# Patient Record
Sex: Female | Born: 1945 | Race: White | Hispanic: No | Marital: Married | Smoking: Former smoker
Health system: Southern US, Community
[De-identification: ages and names within clinical notes are randomized; demographics above are authoritative.]

## PROBLEM LIST (undated history)

## (undated) DIAGNOSIS — M1990 Unspecified osteoarthritis, unspecified site: Secondary | ICD-10-CM

## (undated) DIAGNOSIS — M722 Plantar fascial fibromatosis: Secondary | ICD-10-CM

## (undated) DIAGNOSIS — I10 Essential (primary) hypertension: Secondary | ICD-10-CM

## (undated) HISTORY — PX: PLANTAR FASCIA RELEASE: SHX2239

---

## 1994-02-16 HISTORY — PX: EYE SURGERY: SHX253

## 1998-02-16 HISTORY — PX: APPENDECTOMY: SHX54

## 2005-08-20 ENCOUNTER — Ambulatory Visit: Payer: Self-pay

## 2006-02-11 ENCOUNTER — Ambulatory Visit: Payer: Self-pay | Admitting: Gastroenterology

## 2006-02-18 ENCOUNTER — Ambulatory Visit: Payer: Self-pay | Admitting: Unknown Physician Specialty

## 2006-08-25 ENCOUNTER — Ambulatory Visit: Payer: Self-pay

## 2007-09-08 ENCOUNTER — Ambulatory Visit: Payer: Self-pay | Admitting: Obstetrics and Gynecology

## 2008-09-20 ENCOUNTER — Ambulatory Visit: Payer: Self-pay

## 2009-11-13 ENCOUNTER — Ambulatory Visit: Payer: Self-pay

## 2010-11-25 ENCOUNTER — Ambulatory Visit: Payer: Self-pay | Admitting: Unknown Physician Specialty

## 2011-11-23 ENCOUNTER — Ambulatory Visit: Payer: Self-pay | Admitting: Gastroenterology

## 2011-11-26 ENCOUNTER — Ambulatory Visit: Payer: Self-pay | Admitting: Obstetrics and Gynecology

## 2012-05-10 ENCOUNTER — Ambulatory Visit: Payer: Self-pay | Admitting: Internal Medicine

## 2012-11-29 ENCOUNTER — Ambulatory Visit: Payer: Self-pay | Admitting: Internal Medicine

## 2013-11-30 ENCOUNTER — Ambulatory Visit: Payer: Self-pay | Admitting: Internal Medicine

## 2014-05-23 ENCOUNTER — Ambulatory Visit
Admit: 2014-05-23 | Disposition: A | Discharge status: Home / Self Care | Payer: Self-pay | Attending: Unknown Physician Specialty | Admitting: Unknown Physician Specialty

## 2014-06-15 ENCOUNTER — Encounter: Payer: Self-pay | Admitting: *Deleted

## 2014-06-18 ENCOUNTER — Ambulatory Visit
Admission: RE | Admit: 2014-06-18 | Discharge: 2014-06-18 | Disposition: A | Discharge status: Home / Self Care | Payer: Medicare Other | Source: Ambulatory Visit | Attending: Rheumatology | Admitting: Rheumatology

## 2014-06-18 DIAGNOSIS — Z029 Encounter for administrative examinations, unspecified: Secondary | ICD-10-CM | POA: Diagnosis not present

## 2014-06-22 ENCOUNTER — Ambulatory Visit: Payer: Medicare Other | Admitting: Anesthesiology

## 2014-06-22 ENCOUNTER — Encounter
Admission: RE | Disposition: A | Discharge status: Home / Self Care | Payer: Self-pay | Source: Ambulatory Visit | Attending: Unknown Physician Specialty

## 2014-06-22 ENCOUNTER — Ambulatory Visit
Admission: RE | Admit: 2014-06-22 | Discharge: 2014-06-22 | Disposition: A | Discharge status: Home / Self Care | Payer: Medicare Other | Source: Ambulatory Visit | Attending: Unknown Physician Specialty | Admitting: Unknown Physician Specialty

## 2014-06-22 ENCOUNTER — Encounter: Payer: Self-pay | Admitting: *Deleted

## 2014-06-22 DIAGNOSIS — M179 Osteoarthritis of knee, unspecified: Secondary | ICD-10-CM | POA: Insufficient documentation

## 2014-06-22 DIAGNOSIS — Z87891 Personal history of nicotine dependence: Secondary | ICD-10-CM | POA: Diagnosis not present

## 2014-06-22 DIAGNOSIS — Y929 Unspecified place or not applicable: Secondary | ICD-10-CM | POA: Insufficient documentation

## 2014-06-22 DIAGNOSIS — X58XXXA Exposure to other specified factors, initial encounter: Secondary | ICD-10-CM | POA: Diagnosis not present

## 2014-06-22 DIAGNOSIS — S83241A Other tear of medial meniscus, current injury, right knee, initial encounter: Secondary | ICD-10-CM | POA: Insufficient documentation

## 2014-06-22 HISTORY — DX: Unspecified osteoarthritis, unspecified site: M19.90

## 2014-06-22 HISTORY — DX: Plantar fascial fibromatosis: M72.2

## 2014-06-22 HISTORY — DX: Essential (primary) hypertension: I10

## 2014-06-22 HISTORY — PX: KNEE ARTHROSCOPY WITH MENISCAL REPAIR: SHX5653

## 2014-06-22 SURGERY — ARTHROSCOPY, KNEE, WITH MENISCUS REPAIR
Anesthesia: Monitor Anesthesia Care | Laterality: Right | Wound class: Clean

## 2014-06-22 MED ORDER — LACTATED RINGERS IV SOLN
INTRAVENOUS | Status: DC
  Administered 2014-06-22: 09:00:00 via INTRAVENOUS

## 2014-06-22 MED ORDER — ACETAMINOPHEN 325 MG PO TABS: 325.0000 mg | ORAL_TABLET | ORAL | Status: DC | PRN

## 2014-06-22 MED ORDER — MIDAZOLAM HCL 5 MG/5ML IJ SOLN
INTRAMUSCULAR | Status: DC | PRN
  Administered 2014-06-22: 2 mg via INTRAVENOUS

## 2014-06-22 MED ORDER — ACETAMINOPHEN 160 MG/5ML PO SOLN
325.0000 mg | ORAL | Status: DC | PRN
Start: 2014-06-22 — End: 2014-06-22

## 2014-06-22 MED ORDER — DEXAMETHASONE SODIUM PHOSPHATE 4 MG/ML IJ SOLN
INTRAMUSCULAR | Status: DC | PRN
  Administered 2014-06-22: 4 mg via INTRAVENOUS

## 2014-06-22 MED ORDER — LIDOCAINE HCL (CARDIAC) 20 MG/ML IV SOLN
INTRAVENOUS | Status: DC | PRN
  Administered 2014-06-22: 30 mg via INTRATRACHEAL

## 2014-06-22 MED ORDER — PROPOFOL 10 MG/ML IV BOLUS
INTRAVENOUS | Status: DC | PRN
  Administered 2014-06-22: 140 mg via INTRAVENOUS

## 2014-06-22 MED ORDER — BUPIVACAINE HCL (PF) 0.5 % IJ SOLN
INTRAMUSCULAR | Status: DC | PRN
  Administered 2014-06-22: 15 mL via INTRA_ARTICULAR

## 2014-06-22 MED ORDER — LACTATED RINGERS IR SOLN
Status: DC | PRN
  Administered 2014-06-22: 3400 mL

## 2014-06-22 MED ORDER — FENTANYL CITRATE (PF) 100 MCG/2ML IJ SOLN
INTRAMUSCULAR | Status: DC | PRN
  Administered 2014-06-22 (×2): 25 ug via INTRAVENOUS
  Administered 2014-06-22: 100 ug via INTRAVENOUS
  Administered 2014-06-22 (×2): 25 ug via INTRAVENOUS

## 2014-06-22 MED ORDER — HYDROCODONE-ACETAMINOPHEN 5-325 MG PO TABS: 1.0000 | ORAL_TABLET | Freq: Four times a day (QID) | ORAL | Status: DC | PRN

## 2014-06-22 MED ORDER — ONDANSETRON HCL 4 MG/2ML IJ SOLN
INTRAMUSCULAR | Status: DC | PRN
  Administered 2014-06-22: 4 mg via INTRAVENOUS

## 2014-06-22 MED ORDER — KETOROLAC TROMETHAMINE 30 MG/ML IJ SOLN
INTRAMUSCULAR | Status: DC | PRN
  Administered 2014-06-22: 15 mg via INTRAVENOUS

## 2014-06-22 SURGICAL SUPPLY — 35 items
ARTHROWAND PARAGON T2 (SURGICAL WAND) ×3
BUR RADIUS 3.5 (BURR) IMPLANT
BUR RADIUS 4.0X18.5 (BURR) ×3 IMPLANT
BUR ROUND 5.5 (BURR) IMPLANT
BURR ROUND 12 FLUTE 4.0MM (BURR) IMPLANT
CUFF TOURN SGL QUICK 24 (TOURNIQUET CUFF)
CUFF TOURN SGL QUICK 30 (MISCELLANEOUS)
CUFF TOURN SGL QUICK 34 (TOURNIQUET CUFF) ×2
CUFF TRNQT CYL 24X4X40X1 (TOURNIQUET CUFF) IMPLANT
CUFF TRNQT CYL 34X4X40X1 (TOURNIQUET CUFF) ×1 IMPLANT
CUFF TRNQT CYL LO 30X4X (MISCELLANEOUS) IMPLANT
CUTTER SLOTTED WHISKER 4.0 (BURR) ×3 IMPLANT
DRAPE LEGGINS SURG 28X43 STRL (DRAPES) ×3 IMPLANT
DURAPREP 26ML APPLICATOR (WOUND CARE) ×3 IMPLANT
GAUZE SPONGE 4X4 12PLY STRL (GAUZE/BANDAGES/DRESSINGS) ×3 IMPLANT
GLOVE BIO SURGEON STRL SZ7.5 (GLOVE) ×3 IMPLANT
GLOVE BIO SURGEON STRL SZ8 (GLOVE) ×3 IMPLANT
GLOVE INDICATOR 8.0 STRL GRN (GLOVE) ×3 IMPLANT
GOWN STRL REIN 2XL XLG LVL4 (GOWN DISPOSABLE) ×3 IMPLANT
GOWN STRL REUS W/TWL 2XL LVL3 (GOWN DISPOSABLE) ×3 IMPLANT
IV LACTATED RINGER IRRG 3000ML (IV SOLUTION)
IV LR IRRIG 3000ML ARTHROMATIC (IV SOLUTION) IMPLANT
MANIFOLD 4PT FOR NEPTUNE1 (MISCELLANEOUS) ×3 IMPLANT
PACK ARTHROSCOPY KNEE (MISCELLANEOUS) ×3 IMPLANT
SET TUBE SUCT SHAVER OUTFL 24K (TUBING) ×3 IMPLANT
SUT ETHILON 3-0 FS-10 30 BLK (SUTURE) ×3
SUTURE EHLN 3-0 FS-10 30 BLK (SUTURE) ×1 IMPLANT
TAPE MICROFOAM 4IN (TAPE) ×3 IMPLANT
TUBING ARTHRO INFLOW-ONLY STRL (TUBING) ×3 IMPLANT
WAND ARTHRO PARAGON T2 (SURGICAL WAND) ×1 IMPLANT
WAND COVAC 50 IFS (MISCELLANEOUS) ×3 IMPLANT
WAND HAND CNTRL MULTIVAC 50 (MISCELLANEOUS) IMPLANT
WAND HAND CNTRL MULTIVAC 90 (MISCELLANEOUS) IMPLANT
WAND MEGAVAC 90 (MISCELLANEOUS) IMPLANT
WAND ULTRAVAC 90 (MISCELLANEOUS) IMPLANT

## 2014-06-22 NOTE — Op Note (Signed)
NAMNorva Davis:  Davis, Sheryl              ACCOUNT NO.:  [id number]  MEDICAL RECORD NO.:  [id number]  LOCATION:  [facility]                        FACILITY:  [facility]  PHYSICIAN:  Randon GoldsmithHarold B Okie Jansson, MD  DATE OF BIRTH:  19-Dec-1945  DATE OF PROCEDURE:  06/22/2014 DATE OF DISCHARGE:  06/22/2014                              OPERATIVE REPORT   PREOPERATIVE DIAGNOSIS:  Torn medial meniscus plus chondral changes.  POSTOPERATIVE DIAGNOSIS:  Torn medial meniscus plus chondral changes.  PROCEDURE PERFORMED:  Arthroscopic partial medial meniscectomy plus chondral debridement.  SURGEON:  Randon GoldsmithHarold B Rylei Codispoti, MD  ANESTHESIA:  General.  HISTORY:  The patient had a long history of right knee pain.  It persisted in spite injection of right knee joint with steroid and anesthetic.  MRI was consistent with torn medial meniscus and some medial compartment chondral changes.  The patient was ultimately brought in for arthroscopy.  DESCRIPTION OF PROCEDURE:  The patient was taken to the operating room where satisfactory general anesthesia was achieved.  A leg holder and tourniquet were applied to the patient's right thigh, and the left lower extremity was supported with a well-leg holder.  The right knee was prepped and draped in the usual fashion for a knee procedure.  The inflow cannula was introduced superomedially.  The joint was distended with lactated Ringer's.  The scope was introduced through an inferolateral puncture wound.  A probe was introduced through an anteromedial puncture wound.  Inspection of the medial compartment along with probing of the medial meniscus revealed a degenerative tear of the posterior third of the medial meniscus.  I resected the tear using a combination of basket biters and motorized resector.  The remaining rim was contoured with an ArthroCare thermal wand.  The patient did have about an 8-10 mm in diameter grade 2-3 chondral lesion about the medial aspect of the medial femoral  condyle.  I coblated this lesion with an ArthroCare Paragon wand.  Inspection of the intercondylar Notch failed to reveal a cruciate injury.  Inspection of the lateral compartment failed to reveal any chondral changes or lateral meniscus pathology.  The trochlear groove was slightly fibrillated. There was some fibrillation of the retropatellar surface which I debrided with a Turbo Whisker.  I then coblated the retropatellar surface with the Paragon ArthroCare wand.  The instruments were removed from the joint, and the puncture wounds were closed with 3-0 nylon in vertical mattress fashion.  Several cubic centimeters of 0.5% Marcaine without epinephrine was injected about each puncture wound, and then Betadine was applied to the wounds followed by sterile dressing and ice pack.  The patient was then awakened and transferred to her stretcher bed.  She was taken to the recovery room in satisfactory condition.  The tourniquet was not inflated during the course of the procedure.  Blood loss was negligible.          ______________________________ Randon GoldsmithHarold B Felcia Huebert, MD     HBK/MEDQ  D:  06/22/2014  T:  06/22/2014  Job:  [id number]

## 2014-06-22 NOTE — Anesthesia Postprocedure Evaluation (Signed)
  Anesthesia Post-op Note  Patient: Sheryl BayleyDorothy Davis  Procedure(s) Performed: Procedure(s): KNEE ARTHROSCOPY WITH MENISCAL REPAIR (Right)  Anesthesia type:MAC  Patient location: PACU  Post pain: Pain level controlled  Post assessment: Post-op Vital signs reviewed, Patient's Cardiovascular Status Stable, Respiratory Function Stable, Patent Airway and No signs of Nausea or vomiting  Post vital signs: Reviewed and stable  Last Vitals:  Filed Vitals:   06/22/14 1120  BP: 157/94  Pulse: 81  Temp:   Resp: 11    Level of consciousness: awake, alert  and patient cooperative  Complications: No apparent anesthesia complications

## 2014-06-22 NOTE — H&P (Signed)
  No changes  In H&P. Have hard copy.

## 2014-06-22 NOTE — Anesthesia Procedure Notes (Signed)
Procedure Name: LMA Insertion Performed by: Theone MurdochJONES, Calirose Mccance Pre-anesthesia Checklist: Patient identified, Emergency Drugs available, Suction available, Timeout performed and Patient being monitored Patient Re-evaluated:Patient Re-evaluated prior to inductionOxygen Delivery Method: Circle system utilized Preoxygenation: Pre-oxygenation with 100% oxygen Intubation Type: IV induction LMA: LMA inserted LMA Size: 4.0 Number of attempts: 1 Placement Confirmation: positive ETCO2 and breath sounds checked- equal and bilateral Tube secured with: Tape

## 2014-06-22 NOTE — Brief Op Note (Signed)
06/22/2014  11:21 AM  PATIENT:  Hipolito Bayleyorothy Madlock  69 y.o. female  PRE-OPERATIVE DIAGNOSIS:  RIGHT KNEE MENISCUS TEAR [phone] OSTEOARTHRITIS RIGHT KNEE M17.11  POST-OPERATIVE DIAGNOSIS: same  PROCEDURE:  Partial medial menisectomy plus chondral debridement   SURGEON:  Surgeon(s) and Role:    * Erin SonsHarold Gwyneth Fernandez, MD - Primary  PHYSICIAN ASSISTANT:   ASSISTANTS: none   ANESTHESIA:   general  EBL:     BLOOD ADMINISTERED:none  DRAINS: none   LOCAL MEDICATIONS USED:  MARCAINE     SPECIMEN:  No Specimen  DISPOSITION OF SPECIMEN:  N/A  COUNTS:  YES  TOURNIQUET:    DICTATION: .Note written in EPIC  PLAN OF CARE: Discharge to home after PACU  PATIENT DISPOSITION:  PACU - hemodynamically stable.   Delay start of Pharmacological VTE agent (>24hrs) due to surgical blood loss or risk of bleeding: not applicable

## 2014-06-22 NOTE — Transfer of Care (Signed)
Immediate Anesthesia Transfer of Care Note  Patient: Sheryl Davis  Procedure(s) Performed: Procedure(s): KNEE ARTHROSCOPY WITH MENISCAL REPAIR (Right)  Patient Location: PACU  Anesthesia Type: MAC  Level of Consciousness: awake, alert  and patient cooperative  Airway and Oxygen Therapy: Patient Spontanous Breathing and Patient connected to supplemental oxygen  Post-op Assessment: Post-op Vital signs reviewed, Patient's Cardiovascular Status Stable, Respiratory Function Stable, Patent Airway and No signs of Nausea or vomiting  Post-op Vital Signs: Reviewed and stable  Complications: No apparent anesthesia complications

## 2014-06-22 NOTE — Discharge Instructions (Signed)
Diet: As you were doing prior to hospitalization   Shower:  May shower but keep the wounds dry, use an occlusive plastic wrap or extremity protector. NO SOAKING IN TUB.   Dressing:  You may remove your dressing 3 days after surgery. Then apply waterproof bandaids and change them after showering.   Activity:  Increase activity slowly as tolerated. Can drive when comfortable.    Weight Bearing: as tolerated.    To prevent constipation: you may use a stool softener such as - Miralax (over the counter) for constipation as needed.    To prevent venous clotting Take one 81 mg. ASA tablet  2X per day for about 2 weeks post surgery.  Precautions:  If you experience chest pain or shortness of breath - call 911 immediately for transfer to the hospital emergency department!!  If you develop a fever greater that 101 F, purulent drainage from wound, increased redness or drainage from wound, or calf pain -- Call the office at [phone]                                             Follow- Up Appointment:  Please call for an appointment to be seen in 1 wk.

## 2014-06-22 NOTE — Anesthesia Preprocedure Evaluation (Signed)
Anesthesia Evaluation  Patient identified by MRN, date of birth, ID band Patient awake    Reviewed: Allergy & Precautions, H&P , NPO status , Patient's Chart, lab work & pertinent test results, reviewed documented beta blocker date and time   Airway Mallampati: I  TM Distance: >3 FB Neck ROM: full    Dental no notable dental hx.    Pulmonary neg pulmonary ROS, former smoker,  breath sounds clear to auscultation  Pulmonary exam normal       Cardiovascular Exercise Tolerance: Good hypertension, negative cardio ROS  Rhythm:regular Rate:Normal     Neuro/Psych negative neurological ROS  negative psych ROS   GI/Hepatic negative GI ROS, Neg liver ROS,   Endo/Other  negative endocrine ROS  Renal/GU negative Renal ROS  negative genitourinary   Musculoskeletal   Abdominal   Peds  Hematology negative hematology ROS (+)   Anesthesia Other Findings   Reproductive/Obstetrics negative OB ROS                             Anesthesia Physical Anesthesia Plan  ASA: II  Anesthesia Plan: MAC   Post-op Pain Management:    Induction:   Airway Management Planned:   Additional Equipment:   Intra-op Plan:   Post-operative Plan:   Informed Consent: I have reviewed the patients History and Physical, chart, labs and discussed the procedure including the risks, benefits and alternatives for the proposed anesthesia with the patient or authorized representative who has indicated his/her understanding and acceptance.     Plan Discussed with: CRNA  Anesthesia Plan Comments:         Anesthesia Quick Evaluation

## 2014-06-26 ENCOUNTER — Encounter: Payer: Self-pay | Admitting: Unknown Physician Specialty

## 2014-11-01 ENCOUNTER — Other Ambulatory Visit: Payer: Self-pay | Admitting: Internal Medicine

## 2014-11-01 DIAGNOSIS — Z1231 Encounter for screening mammogram for malignant neoplasm of breast: Secondary | ICD-10-CM

## 2014-12-03 ENCOUNTER — Ambulatory Visit: Payer: Medicare Other

## 2015-07-19 ENCOUNTER — Ambulatory Visit (INDEPENDENT_AMBULATORY_CARE_PROVIDER_SITE_OTHER)
Admission: RE | Admit: 2015-07-19 | Discharge: 2015-07-19 | Disposition: A | Discharge status: Home / Self Care | Payer: Medicare Other | Source: Ambulatory Visit | Attending: Pulmonary Disease | Admitting: Pulmonary Disease

## 2015-07-19 ENCOUNTER — Ambulatory Visit (INDEPENDENT_AMBULATORY_CARE_PROVIDER_SITE_OTHER): Payer: Medicare Other | Admitting: Pulmonary Disease

## 2015-07-19 ENCOUNTER — Encounter: Payer: Self-pay | Admitting: Pulmonary Disease

## 2015-07-19 VITALS — BP 128/72 | HR 88 | Ht 63.0 in | Wt 213.0 lb

## 2015-07-19 DIAGNOSIS — R0600 Dyspnea, unspecified: Secondary | ICD-10-CM | POA: Diagnosis not present

## 2015-07-19 DIAGNOSIS — R0602 Shortness of breath: Secondary | ICD-10-CM | POA: Diagnosis not present

## 2015-07-19 NOTE — Progress Notes (Signed)
Subjective:    Patient ID: Sheryl Davis, female    DOB: 25-Dec-1945, 70 y.o.   MRN: [id number]  HPI Chief Complaint  Patient presents with  . Advice Only    Self referral for SOB with exertion.  s/s present approx 6 mos.      "My daughter seems to think I have shortness of breath."  Dyspnea: > she believes it is due to her weight > she lives a sedentary lifestyle (work, home) > she says she enjoys eating and she has gained weight over the years > she retired on 3/31 and she has lost weight and her dyspnea has improved > she notes that she would get short of breath with 2 flights of stairs > her daughter notes that she gets short of breath with housework, vacuuming, moving furniture > carrying in groceries is hard > she feels OK when she walks on level ground, but she can't keep up with family like she can > she is quite insistent that this is improving and she doesn't feel it is a problem > she has been more active since retirement and has been watching what she eats, hence the weight loss > no orthopnea, numbness, tingling  Cough > she does cough "sometimes", and brings up mucus sometimes > she attributes to her sinuses   She formerly smoked and she quit smoking in 2000.   > she smoked < 1 PPD, the most was 7-8 a day  "chest colds" > she feels like it is happening more frequently > she has been treated by Dr. Graciela Husbands for bronchtis a few times, but no recent antibiotics > has never received prednisone  Medical therapies: > has had antibiotics years ago > albuterol many years ago when she had albuterol  Hospitalization: > she once had bad pleurisy with bronchits requiring a hospital visit at [facility] 20 years ago     Past Medical History  Diagnosis Date  . Hypertension   . Plantar fasciitis   . Arthritis      Family History  Problem Relation Age of Onset  . Emphysema Father   . Throat cancer Father   . Lung cancer Father      Social History    Social History  . Marital Status: Married    Spouse Name: N/A  . Number of Children: N/A  . Years of Education: N/A   Occupational History  . Not on file.   Social History Main Topics  . Smoking status: Former Smoker -- 0.50 packs/day for 40 years    Types: Cigarettes    Quit date: 06/15/1998  . Smokeless tobacco: Never Used  . Alcohol Use: 0.0 oz/week    0 Standard drinks or equivalent per week     Comment: 1glass wine every few months  . Drug Use: Not on file  . Sexual Activity: Not on file   Other Topics Concern  . Not on file   Social History Narrative     No Known Allergies   Outpatient Prescriptions Prior to Visit  Medication Sig Dispense Refill  . amLODipine (NORVASC) 10 MG tablet Take 5 mg by mouth 2 (two) times daily. AM    . Ascorbic Acid (VITAMIN C PO) Take 1,000 mg by mouth daily.    Marland Kitchen aspirin 81 MG tablet Take 81 mg by mouth daily.    . B Complex-C (SUPER B COMPLEX PO) Take by mouth daily.    . benazepril (LOTENSIN) 10 MG tablet Take 10 mg by  mouth daily. AM    . Bioflavonoid Products (ESTER C PO) Take 1,000 mg by mouth daily.    . calcium carbonate (OS-CAL) 600 MG TABS tablet Take 600 mg by mouth daily.    . Cholecalciferol (VITAMIN D-3 PO) Take 1,000 Int'l Units by mouth daily.    . COCONUT OIL PO Take 1,000 mg by mouth daily.    . Flaxseed, Linseed, (FLAXSEED OIL PO) Take 1,000 mg by mouth daily.    . Garlic 1000 MG CAPS Take 1,000 mg by mouth daily.    Marland Kitchen GINKGO BILOBA PO Take 120 mg by mouth daily.    Marland Kitchen GLUCOSAMINE CHONDROITIN COMPLX PO Take 1 capsule by mouth daily.     . Multiple Vitamins-Minerals (ONE-A-DAY 50 PLUS PO) Take by mouth daily.    . Omega-3 Fatty Acids (FISH OIL PO) Take 1,000 mg by mouth daily.    . Red Yeast Rice Extract (RED YEAST RICE PO) Take 600 mg by mouth daily.    Marland Kitchen VITAMIN E PO Take 400 Int'l Units by mouth daily.    . Zinc 50 MG TABS Take 50 mg by mouth daily.    Marland Kitchen HYDROcodone-acetaminophen (NORCO) 5-325 MG per tablet Take  1-2 tablets by mouth every 6 (six) hours as needed for moderate pain. MAXIMUM TOTAL ACETAMINOPHEN DOSE IS 4000 MG PER DAY (Patient not taking: Reported on 07/19/2015) 20 tablet 0   No facility-administered medications prior to visit.       Review of Systems  Constitutional: Negative for fever and unexpected weight change.  HENT: Positive for postnasal drip. Negative for congestion, dental problem, ear pain, nosebleeds, rhinorrhea, sinus pressure, sneezing, sore throat and trouble swallowing.   Eyes: Negative for redness and itching.  Respiratory: Positive for shortness of breath. Negative for cough, chest tightness and wheezing.   Cardiovascular: Negative for palpitations and leg swelling.  Gastrointestinal: Negative for nausea and vomiting.  Genitourinary: Negative for dysuria.  Musculoskeletal: Negative for joint swelling.  Skin: Negative for rash.  Neurological: Negative for headaches.  Hematological: Does not bruise/bleed easily.  Psychiatric/Behavioral: Negative for dysphoric mood. The patient is not nervous/anxious.        Objective:   Physical Exam Filed Vitals:   07/19/15 1456  BP: 128/72  Pulse: 88  Height: 5\' 3"  (1.6 m)  Weight: 213 lb (96.616 kg)  SpO2: 94%   RA  Gen: obese but well appearing, no acute distress HENT: NCAT, OP clear, neck supple without masses Eyes: PERRL, EOMi Lymph: no cervical lymphadenopathy PULM: CTA B CV: RRR, no mgr, no JVD GI: BS+, soft, nontender, no hsm Derm: no rash or skin breakdown MSK: normal bulk and tone Neuro: A&Ox4, CN II-XII intact, strength 5/5 in all 4 extremities Psyche: normal mood and affect  September 2016 pulmonary function test from [facility]: Ratio 73%, FEV1 1.17 L (83% predicted), FVC 2.35 L (91% predicted), flow volume loop is suggestive of small airways disease, total lung capacity 4.41 L (99% predicted), residual volume is 113% predicted, DLCO 22.8 (111% predicted).  March 2017 hemoglobin is 13.3  Nuclear  stress test> "SPECT images demonstrate homogeneous tracer  distribution throughout the myocardium", LVEF 69%     Assessment & Plan:  Dyspnea Sheryl Davis is here to see me today for evaluation of shortness of breath. While she had an extensive smoking history (20 pack years, she smoked one half pack of cigarettes daily for 40 years and quit 17 years ago), I see no evidence of COPD on today's pulmonary function testing. There  may be a suggestion of small airways disease and a trend towards hyperinflation but there is really no significant evidence of an underlying lung disease from her pulmonary function test right now.  Further, her exam is normal and her resting oximetry is normal today.  I explained to her and her daughter that the differential diagnosis of dyspnea is broad and includes lung disease, heart disease, hematologic conditions, neurologic conditions among other diseases. Fortunately at this time based on the workup that has been performed by her primary care physician (see above) and my assessment today I cannot see clear evidence of an underlying heart lung hematologic or neurologic disease that may have caused her dyspnea.  I think the most likely explanation for her dyspnea is deconditioning and obesity.  Plan: Chest x-ray to ensure there is no evidence of emphysema Trial of Incruise given suggestion of small airways disease on pulmonary function testing She was instructed today to diet and exercise regularly, specifically with calorie counting in walking Follow-up in 3 months, if no improvement in dyspnea the consider a CT chest to evaluate for emphysema not seen previously on chest x-ray      Current outpatient prescriptions:  .  amLODipine (NORVASC) 10 MG tablet, Take 5 mg by mouth 2 (two) times daily. AM, Disp: , Rfl:  .  Ascorbic Acid (VITAMIN C PO), Take 1,000 mg by mouth daily., Disp: , Rfl:  .  aspirin 81 MG tablet, Take 81 mg by mouth daily., Disp: , Rfl:  .  B  Complex-C (SUPER B COMPLEX PO), Take by mouth daily., Disp: , Rfl:  .  benazepril (LOTENSIN) 10 MG tablet, Take 10 mg by mouth daily. AM, Disp: , Rfl:  .  Bioflavonoid Products (ESTER C PO), Take 1,000 mg by mouth daily., Disp: , Rfl:  .  calcium carbonate (OS-CAL) 600 MG TABS tablet, Take 600 mg by mouth daily., Disp: , Rfl:  .  Cholecalciferol (VITAMIN D-3 PO), Take 1,000 Int'l Units by mouth daily., Disp: , Rfl:  .  COCONUT OIL PO, Take 1,000 mg by mouth daily., Disp: , Rfl:  .  Flaxseed, Linseed, (FLAXSEED OIL PO), Take 1,000 mg by mouth daily., Disp: , Rfl:  .  Garlic 1000 MG CAPS, Take 1,000 mg by mouth daily., Disp: , Rfl:  .  GINKGO BILOBA PO, Take 120 mg by mouth daily., Disp: , Rfl:  .  GLUCOSAMINE CHONDROITIN COMPLX PO, Take 1 capsule by mouth daily. , Disp: , Rfl:  .  Multiple Vitamins-Minerals (ONE-A-DAY 50 PLUS PO), Take by mouth daily., Disp: , Rfl:  .  Omega-3 Fatty Acids (FISH OIL PO), Take 1,000 mg by mouth daily., Disp: , Rfl:  .  Red Yeast Rice Extract (RED YEAST RICE PO), Take 600 mg by mouth daily., Disp: , Rfl:  .  VITAMIN E PO, Take 400 Int'l Units by mouth daily., Disp: , Rfl:  .  Zinc 50 MG TABS, Take 50 mg by mouth daily., Disp: , Rfl:

## 2015-07-19 NOTE — Assessment & Plan Note (Signed)
Sheryl Davis is here to see me today for evaluation of shortness of breath. While she had an extensive smoking history (20 pack years, she smoked one half pack of cigarettes daily for 40 years and quit 17 years ago), I see no evidence of COPD on today's pulmonary function testing. There may be a suggestion of small airways disease and a trend towards hyperinflation but there is really no significant evidence of an underlying lung disease from her pulmonary function test right now.  Further, her exam is normal and her resting oximetry is normal today.  I explained to her and her daughter that the differential diagnosis of dyspnea is broad and includes lung disease, heart disease, hematologic conditions, neurologic conditions among other diseases. Fortunately at this time based on the workup that has been performed by her primary care physician (see above) and my assessment today I cannot see clear evidence of an underlying heart lung hematologic or neurologic disease that may have caused her dyspnea.  I think the most likely explanation for her dyspnea is deconditioning and obesity.  Plan: Chest x-ray to ensure there is no evidence of emphysema Trial of Incruise given suggestion of small airways disease on pulmonary function testing She was instructed today to diet and exercise regularly, specifically with calorie counting in walking Follow-up in 3 months, if no improvement in dyspnea the consider a CT chest to evaluate for emphysema not seen previously on chest x-ray

## 2015-07-19 NOTE — Patient Instructions (Signed)
Take the Incruise 1 puff daily, no matter how you feel We will call you with results of the chest x-ray I recommend that you try to lose weight through diet and exercise I recommend that you download My Fitness Pal and log all of your calories I will see you back in 3 months or sooner if needed

## 2015-07-23 ENCOUNTER — Telehealth: Payer: Self-pay | Admitting: *Deleted

## 2015-07-23 ENCOUNTER — Telehealth: Payer: Self-pay | Admitting: Pulmonary Disease

## 2015-07-23 DIAGNOSIS — R9389 Abnormal findings on diagnostic imaging of other specified body structures: Secondary | ICD-10-CM

## 2015-07-23 NOTE — Telephone Encounter (Signed)
Order entry for CT. Nothing further needed.

## 2015-07-23 NOTE — Telephone Encounter (Signed)
-----   Message from Lupita Leashouglas B McQuaid, MD sent at 07/19/2015  5:23 PM EDT ----- A, Please let the patient know this showed scarring in her lungs and the radiologist has suggested that she have a CT scan now.  Please order a HRCT, dyspnea, smoker, Entrikin or Bleitz to read. Thanks, B

## 2015-07-23 NOTE — Telephone Encounter (Signed)
Patient notified of CXR results. High Res CT ordered. Patient aware. Nothing further needed.

## 2015-07-26 ENCOUNTER — Ambulatory Visit (INDEPENDENT_AMBULATORY_CARE_PROVIDER_SITE_OTHER)
Admission: RE | Admit: 2015-07-26 | Discharge: 2015-07-26 | Disposition: A | Discharge status: Home / Self Care | Payer: Medicare Other | Source: Ambulatory Visit | Attending: Pulmonary Disease | Admitting: Pulmonary Disease

## 2015-07-26 DIAGNOSIS — J479 Bronchiectasis, uncomplicated: Secondary | ICD-10-CM

## 2015-07-26 DIAGNOSIS — R0600 Dyspnea, unspecified: Secondary | ICD-10-CM

## 2015-07-26 DIAGNOSIS — R9389 Abnormal findings on diagnostic imaging of other specified body structures: Secondary | ICD-10-CM

## 2015-07-26 DIAGNOSIS — R938 Abnormal findings on diagnostic imaging of other specified body structures: Secondary | ICD-10-CM | POA: Diagnosis not present

## 2015-08-01 DIAGNOSIS — J479 Bronchiectasis, uncomplicated: Secondary | ICD-10-CM | POA: Insufficient documentation

## 2015-10-25 ENCOUNTER — Ambulatory Visit (INDEPENDENT_AMBULATORY_CARE_PROVIDER_SITE_OTHER): Payer: Medicare Other | Admitting: Pulmonary Disease

## 2015-10-25 ENCOUNTER — Encounter: Payer: Self-pay | Admitting: Pulmonary Disease

## 2015-10-25 DIAGNOSIS — J470 Bronchiectasis with acute lower respiratory infection: Secondary | ICD-10-CM

## 2015-10-25 NOTE — Progress Notes (Signed)
Subjective:    Patient ID: Sheryl Davis, female    DOB: [birth year], 70 y.o.   MRN: [id number]  Synopsis: Referred in June 2017 for evaluation of dyspnea after a 20-pack-year smoking history. Lung function testing in September 2016 from [facility] showed no evidence of airflow obstruction but a suggestion of small airways disease.  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 She reports a severe respiratory infection around 2002 which required hospitalization for 3 days.  September 2016 pulmonary function test from [facility]: Ratio 73%, FEV1 1.17 L (83% predicted), FVC 2.35 L (91% predicted), flow volume loop is suggestive of small airways disease, total lung capacity 4.41 L (99% predicted), residual volume is 113% predicted, DLCO 22.8 (111% predicted).  March 2017 hemoglobin is 13.3  Nuclear stress test> "SPECT images demonstrate homogeneous tracer  distribution throughout the myocardium", LVEF 69%  HPI Chief Complaint  Patient presents with  . Follow-up    review CT chest.  pt has no breathing complaints today.     Alexarae has been doing well since the last visit. She denies any shortness of breath. She took the inhaled medicines we prescribed her on the last visit but she said that they really didn't help much. She may have noticed a slight change in cough during that time but she denied any sort of significant improvement in any dyspnea. Specifically, she said she really didn't have much shortness of breath is start with so she didn't expect much improvement. After she stopped taking the inhaled medicines she saw no change in her respiratory status. She remains active but she is a bit frustrated with retirement as she has been bored. No cough, no bronchitis since the last visit.  She recalls a  severe episode of "pleurisy" which required a 3 day hospitalization about 15 years ago.  Past Medical History:  Diagnosis Date  . Arthritis   . Hypertension   . Plantar fasciitis       Review of Systems     Objective:   Physical Exam Vitals:   10/25/15 1637  BP: 136/84  Pulse: 84  SpO2: 95%  Weight: 214 lb 9.6 oz (97.3 kg)  Height: 5\' 3"  (1.6 m)   Room air  Gen: well appearing HENT: OP clear, neck supple PULM: CTA B, normal percussion CV: RRR, no mgr, trace edema GI: BS+, soft, nontender Derm: no cyanosis or rash Psyche: normal mood and affect  CT chest images personally reviewed, see my description above      Assessment & Plan:  Bronchiectasis (HCC) She has mild bronchiectasis changes seen predominantly in the right middle lobe. It seems that this is likely related to a severe respiratory infection she had around 2002 when she was hospitalized for 3 days. I suspect she had right middle lobe lobar pneumonia which led to this very mild finding.  Patients with mild, isolated bronchiectasis are at very slightly increased risk for respiratory infections but this should not really cause any dyspnea.  Fortunately, she is currently completely asymptomatic from a respiratory standpoint.  She did have some mild small airways disease seen on her pulmonary function tests as well as her CT scan. While this does not represent significant obstructive lung disease, its probably somehow related to her prior smoking history.  Plan: Get a flu shot every year Basic and hygiene techniques reviewed today No  indication for inhaled therapy as she is asymptomatic Follow-up as needed If she does develop worsening dyspnea then would repeat pulmonary function testing first    Current Outpatient Prescriptions:  .  amLODipine (NORVASC) 10 MG tablet, Take 5 mg by mouth 2 (two) times daily. AM, Disp: , Rfl:  .  Ascorbic Acid (VITAMIN C PO), Take 1,000 mg by mouth daily., Disp: , Rfl:  .   aspirin 81 MG tablet, Take 81 mg by mouth daily., Disp: , Rfl:  .  B Complex-C (SUPER B COMPLEX PO), Take by mouth daily., Disp: , Rfl:  .  benazepril (LOTENSIN) 10 MG tablet, Take 10 mg by mouth daily. AM, Disp: , Rfl:  .  Bioflavonoid Products (ESTER C PO), Take 1,000 mg by mouth daily., Disp: , Rfl:  .  calcium carbonate (OS-CAL) 600 MG TABS tablet, Take 600 mg by mouth daily., Disp: , Rfl:  .  Cholecalciferol (VITAMIN D-3 PO), Take 1,000 Int'l Units by mouth daily., Disp: , Rfl:  .  COCONUT OIL PO, Take 1,000 mg by mouth daily., Disp: , Rfl:  .  Flaxseed, Linseed, (FLAXSEED OIL PO), Take 1,000 mg by mouth daily., Disp: , Rfl:  .  Garlic 1000 MG CAPS, Take 1,000 mg by mouth daily., Disp: , Rfl:  .  GINKGO BILOBA PO, Take 120 mg by mouth daily., Disp: , Rfl:  .  GLUCOSAMINE CHONDROITIN COMPLX PO, Take 1 capsule by mouth daily. , Disp: , Rfl:  .  Multiple Vitamins-Minerals (ONE-A-DAY 50 PLUS PO), Take by mouth daily., Disp: , Rfl:  .  Omega-3 Fatty Acids (FISH OIL PO), Take 1,000 mg by mouth daily., Disp: , Rfl:  .  Red Yeast Rice Extract (RED YEAST RICE PO), Take 600 mg by mouth daily., Disp: , Rfl:  .  VITAMIN E PO, Take 400 Int'l Units by mouth daily., Disp: , Rfl:  .  Zinc 50 MG TABS, Take 50 mg by mouth daily., Disp: , Rfl:

## 2015-10-25 NOTE — Assessment & Plan Note (Signed)
She has mild bronchiectasis changes seen predominantly in the right middle lobe. It seems that this is likely related to a severe respiratory infection she had around 2002 when she was hospitalized for 3 days. I suspect she had right middle lobe lobar pneumonia which led to this very mild finding.  Patients with mild, isolated bronchiectasis are at very slightly increased risk for respiratory infections but this should not really cause any dyspnea.  Fortunately, she is currently completely asymptomatic from a respiratory standpoint.  She did have some mild small airways disease seen on her pulmonary function tests as well as her CT scan. While this does not represent significant obstructive lung disease, its probably somehow related to her prior smoking history.  Plan: Get a flu shot every year Basic and hygiene techniques reviewed today No indication for inhaled therapy as she is asymptomatic Follow-up as needed If she does develop worsening dyspnea then would repeat pulmonary function testing first

## 2015-10-25 NOTE — Patient Instructions (Signed)
Get a flu shot every year Come back to see us if you develop any respiratory problems

## 2015-11-04 ENCOUNTER — Other Ambulatory Visit: Payer: Self-pay | Admitting: Internal Medicine

## 2015-11-04 DIAGNOSIS — Z1231 Encounter for screening mammogram for malignant neoplasm of breast: Secondary | ICD-10-CM

## 2015-11-13 ENCOUNTER — Ambulatory Visit
Admission: RE | Admit: 2015-11-13 | Discharge: 2015-11-13 | Disposition: A | Discharge status: Home / Self Care | Payer: Medicare Other | Source: Ambulatory Visit | Attending: Internal Medicine | Admitting: Internal Medicine

## 2015-11-13 DIAGNOSIS — Z1231 Encounter for screening mammogram for malignant neoplasm of breast: Secondary | ICD-10-CM | POA: Insufficient documentation

## 2016-11-05 ENCOUNTER — Other Ambulatory Visit: Payer: Self-pay | Admitting: Internal Medicine

## 2016-11-05 DIAGNOSIS — Z1231 Encounter for screening mammogram for malignant neoplasm of breast: Secondary | ICD-10-CM

## 2016-11-24 ENCOUNTER — Ambulatory Visit
Admission: RE | Admit: 2016-11-24 | Discharge: 2016-11-24 | Disposition: A | Discharge status: Home / Self Care | Payer: Medicare Other | Source: Ambulatory Visit | Attending: Internal Medicine | Admitting: Internal Medicine

## 2016-11-24 DIAGNOSIS — Z1231 Encounter for screening mammogram for malignant neoplasm of breast: Secondary | ICD-10-CM | POA: Diagnosis present

## 2017-03-27 IMAGING — MG MM DIGITAL SCREENING BILAT W/ TOMO W/ CAD
8 of 13 series · 8 of 29 positions shown · non-contrast
Comparison: Previous exam(s).

CLINICAL DATA: Screening.

EXAM:
2D DIGITAL SCREENING BILATERAL MAMMOGRAM WITH CAD AND ADJUNCT TOMO

[R MLO (1 of 2)]
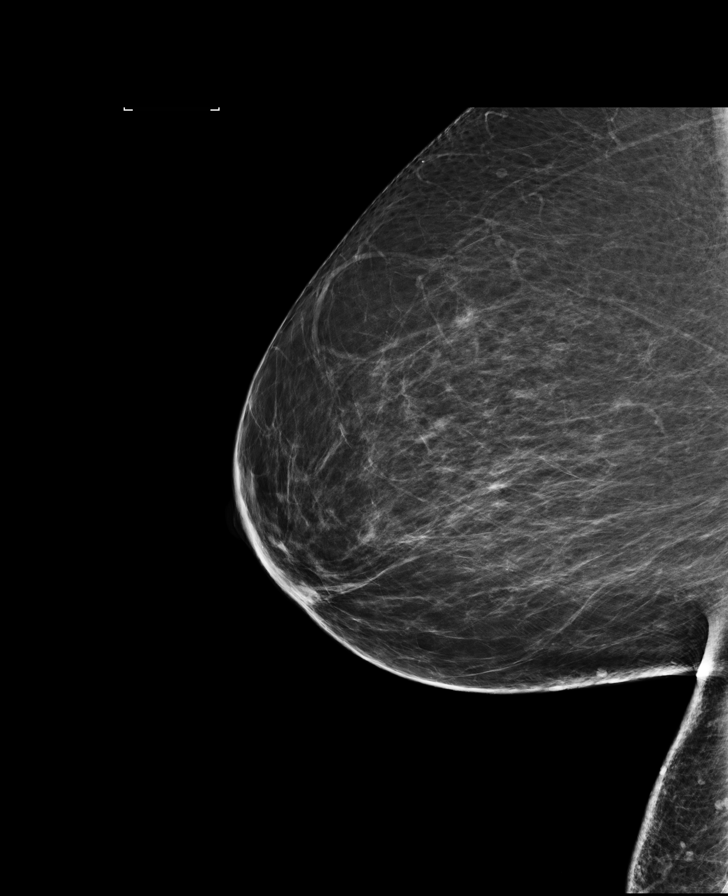

[R MLO synth-2D]
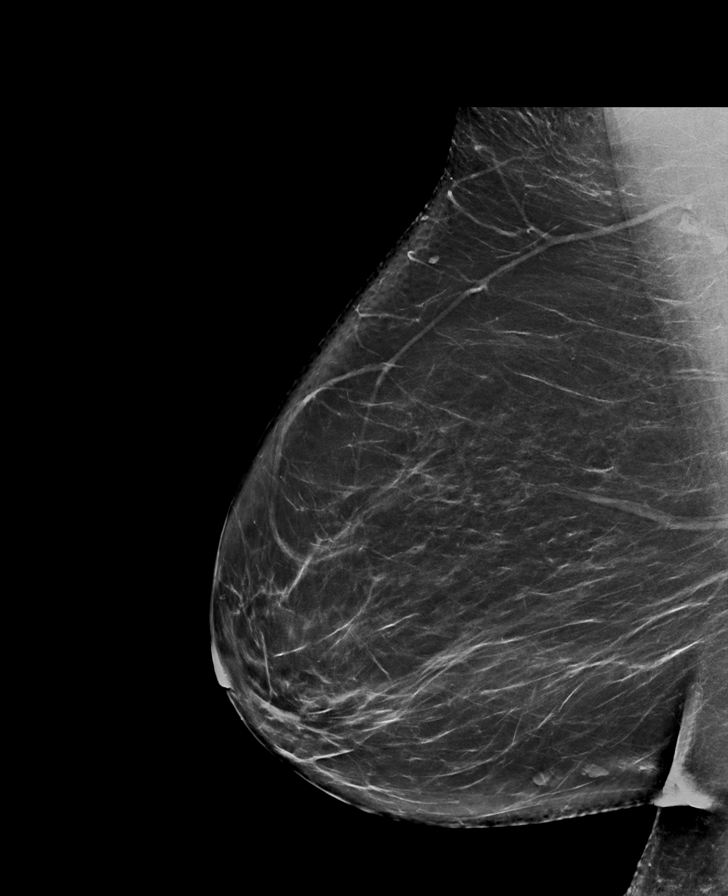

[L CC]
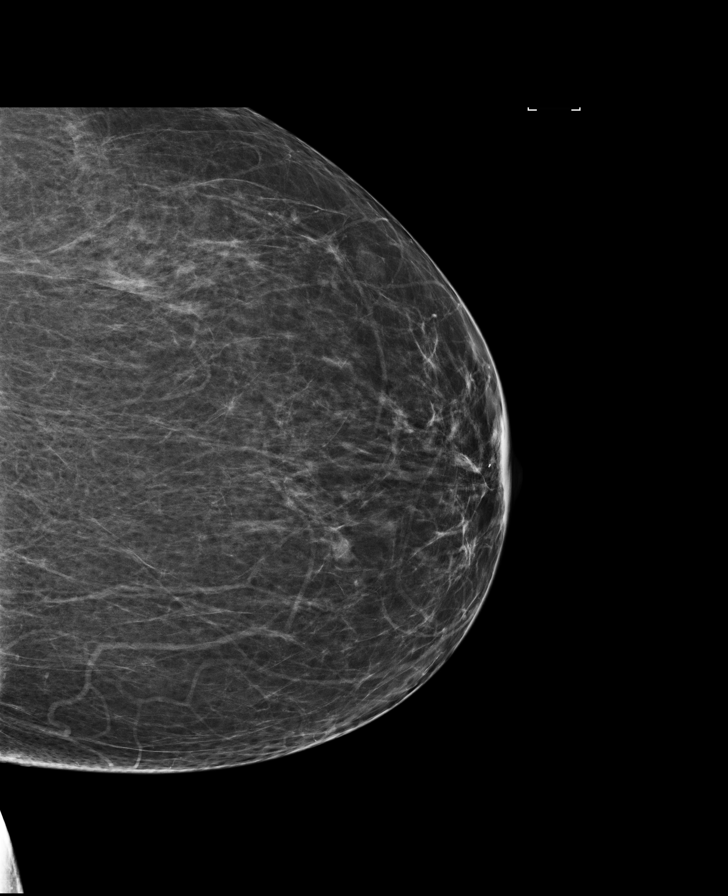

[R CC synth-2D]
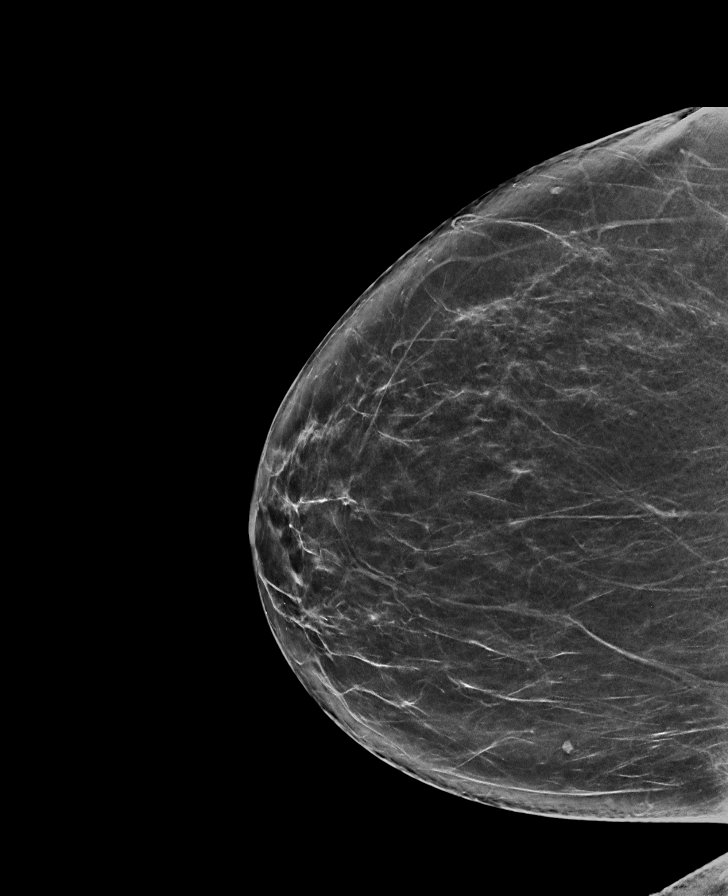

[L CC synth-2D]
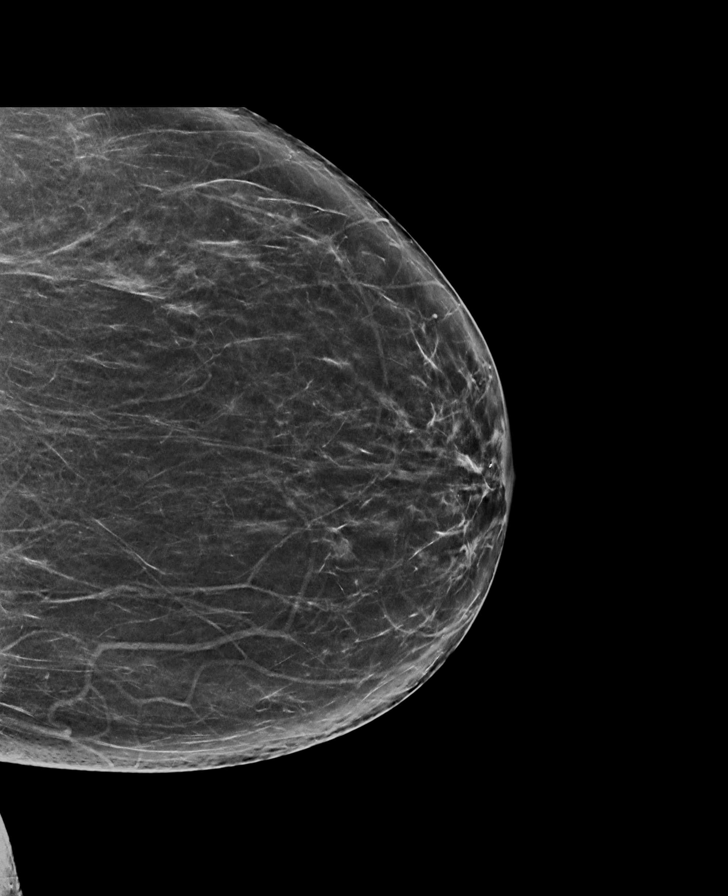

[R CC]
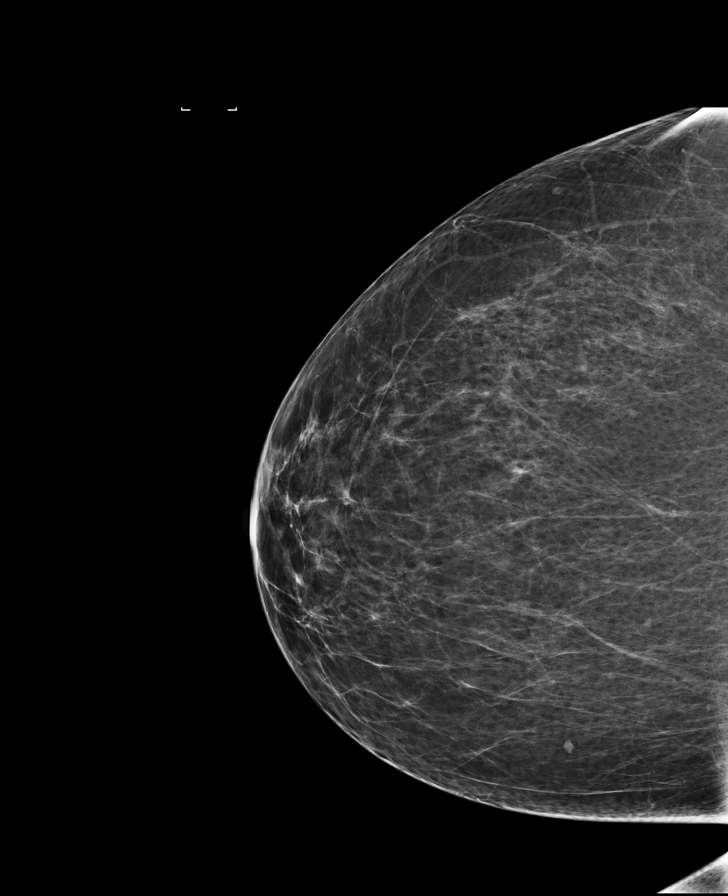

[R MLO (2 of 2)]
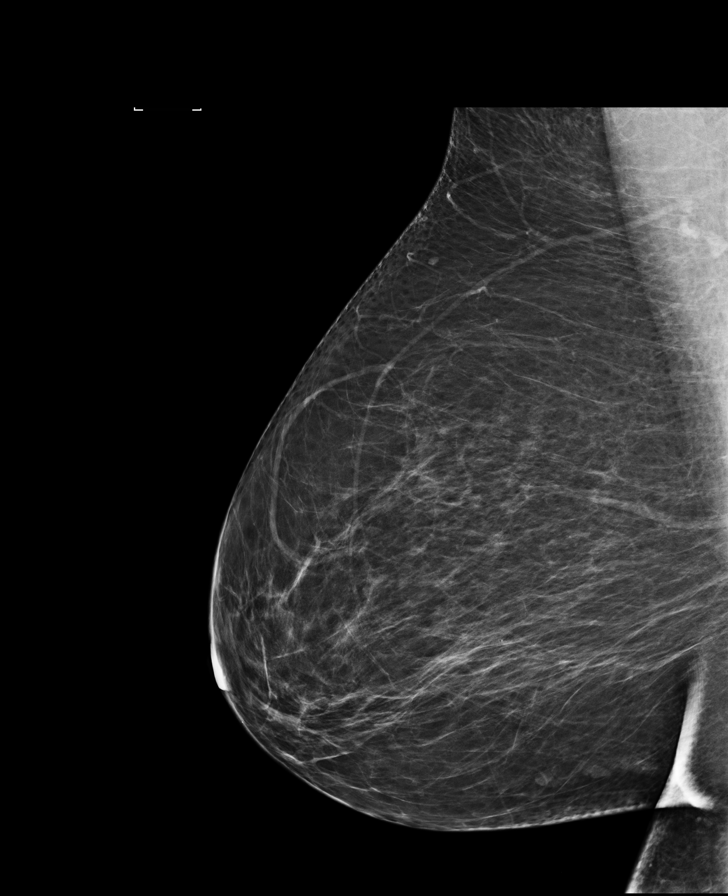

[L MLO]
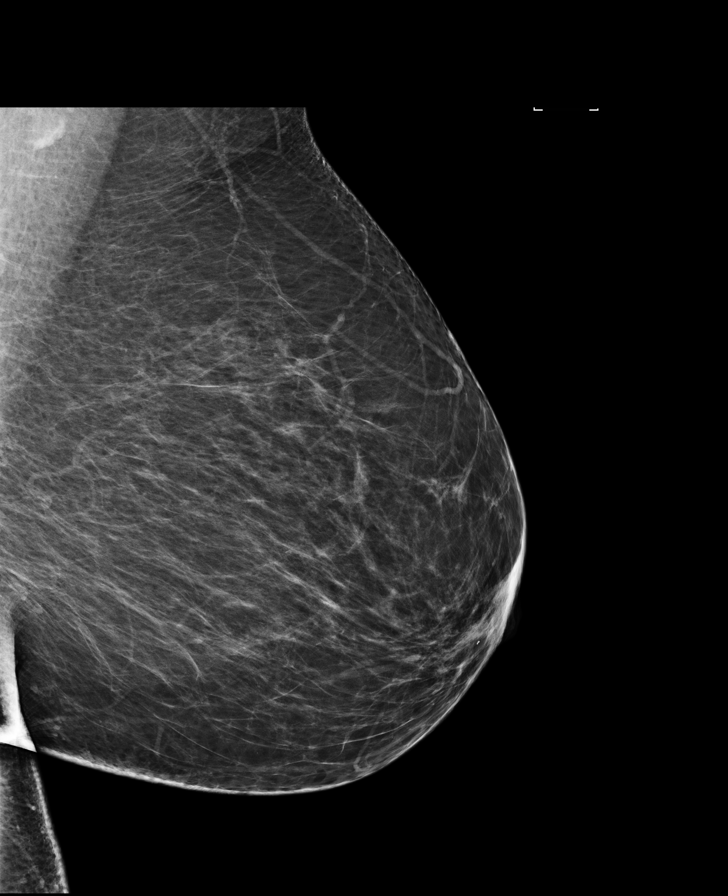

[8 of 29 positions shown; findings below may reference images not displayed]

ACR Breast Density Category b: There are scattered areas of
fibroglandular density.
FINDINGS: There are no findings suspicious for malignancy. Images were
processed with CAD.
IMPRESSION: No mammographic evidence of malignancy. A result letter of this
screening mammogram will be mailed directly to the patient.

RECOMMENDATION:
Screening mammogram in one year. (Code:97-6-RS4)

BI-RADS CATEGORY  1: Negative.

## 2018-02-22 ENCOUNTER — Other Ambulatory Visit: Payer: Self-pay | Admitting: Internal Medicine

## 2018-02-22 DIAGNOSIS — Z1231 Encounter for screening mammogram for malignant neoplasm of breast: Secondary | ICD-10-CM

## 2018-03-22 ENCOUNTER — Ambulatory Visit
Admission: RE | Admit: 2018-03-22 | Discharge: 2018-03-22 | Disposition: A | Discharge status: Home / Self Care | Payer: Medicare Other | Source: Ambulatory Visit | Attending: Internal Medicine | Admitting: Internal Medicine

## 2018-03-22 DIAGNOSIS — Z1231 Encounter for screening mammogram for malignant neoplasm of breast: Secondary | ICD-10-CM | POA: Diagnosis not present

## 2019-02-28 ENCOUNTER — Other Ambulatory Visit: Payer: Self-pay | Admitting: Internal Medicine

## 2019-02-28 DIAGNOSIS — Z1231 Encounter for screening mammogram for malignant neoplasm of breast: Secondary | ICD-10-CM

## 2019-03-24 ENCOUNTER — Ambulatory Visit
Admission: RE | Admit: 2019-03-24 | Discharge: 2019-03-24 | Disposition: A | Discharge status: Home / Self Care | Payer: Medicare Other | Source: Ambulatory Visit | Attending: Internal Medicine | Admitting: Internal Medicine

## 2019-03-24 DIAGNOSIS — Z1231 Encounter for screening mammogram for malignant neoplasm of breast: Secondary | ICD-10-CM | POA: Insufficient documentation

## 2021-07-07 ENCOUNTER — Other Ambulatory Visit: Payer: Medicare Other

## 2021-07-08 ENCOUNTER — Ambulatory Visit
Admission: RE | Admit: 2021-07-08 | Discharge: 2021-07-08 | Disposition: A | Discharge status: Home / Self Care | Payer: Medicare Other | Source: Ambulatory Visit | Attending: Internal Medicine | Admitting: Internal Medicine

## 2021-07-08 ENCOUNTER — Other Ambulatory Visit: Payer: Self-pay | Admitting: Internal Medicine

## 2021-07-08 DIAGNOSIS — K5792 Diverticulitis of intestine, part unspecified, without perforation or abscess without bleeding: Secondary | ICD-10-CM

## 2021-07-08 MED ORDER — IOPAMIDOL (ISOVUE-300) INJECTION 61%
100.0000 mL | Freq: Once | INTRAVENOUS | Status: AC | PRN
  Administered 2021-07-08: 100 mL via INTRAVENOUS

## 2022-11-16 ENCOUNTER — Other Ambulatory Visit: Payer: Self-pay | Admitting: Internal Medicine

## 2022-11-16 DIAGNOSIS — I10 Essential (primary) hypertension: Secondary | ICD-10-CM

## 2022-11-16 DIAGNOSIS — E7849 Other hyperlipidemia: Secondary | ICD-10-CM

## 2022-11-20 ENCOUNTER — Other Ambulatory Visit: Payer: Self-pay | Admitting: Internal Medicine

## 2022-11-20 DIAGNOSIS — Z1231 Encounter for screening mammogram for malignant neoplasm of breast: Secondary | ICD-10-CM

## 2022-11-25 ENCOUNTER — Ambulatory Visit
Admission: RE | Admit: 2022-11-25 | Discharge: 2022-11-25 | Disposition: A | Discharge status: Home / Self Care | Payer: Self-pay | Source: Ambulatory Visit | Attending: Internal Medicine | Admitting: Internal Medicine

## 2022-11-25 DIAGNOSIS — I10 Essential (primary) hypertension: Secondary | ICD-10-CM | POA: Insufficient documentation

## 2022-11-25 DIAGNOSIS — E7849 Other hyperlipidemia: Secondary | ICD-10-CM | POA: Insufficient documentation

## 2022-11-26 ENCOUNTER — Ambulatory Visit
Admission: RE | Admit: 2022-11-26 | Discharge: 2022-11-26 | Disposition: A | Discharge status: Home / Self Care | Payer: Medicare Other | Source: Ambulatory Visit | Attending: Internal Medicine | Admitting: Internal Medicine

## 2022-11-26 DIAGNOSIS — Z1231 Encounter for screening mammogram for malignant neoplasm of breast: Secondary | ICD-10-CM | POA: Diagnosis present

## 2022-12-31 ENCOUNTER — Emergency Department: Payer: Medicare Other

## 2022-12-31 ENCOUNTER — Observation Stay
Admission: EM | Admit: 2022-12-31 | Discharge: 2023-01-01 | Disposition: A | Discharge status: Home / Self Care | Payer: Medicare Other | Attending: Internal Medicine | Admitting: Internal Medicine

## 2022-12-31 DIAGNOSIS — Z87891 Personal history of nicotine dependence: Secondary | ICD-10-CM | POA: Diagnosis not present

## 2022-12-31 DIAGNOSIS — I10 Essential (primary) hypertension: Secondary | ICD-10-CM | POA: Diagnosis not present

## 2022-12-31 DIAGNOSIS — Z79899 Other long term (current) drug therapy: Secondary | ICD-10-CM | POA: Insufficient documentation

## 2022-12-31 DIAGNOSIS — Z7982 Long term (current) use of aspirin: Secondary | ICD-10-CM | POA: Insufficient documentation

## 2022-12-31 DIAGNOSIS — I200 Unstable angina: Principal | ICD-10-CM | POA: Diagnosis present

## 2022-12-31 DIAGNOSIS — E785 Hyperlipidemia, unspecified: Secondary | ICD-10-CM | POA: Diagnosis present

## 2022-12-31 DIAGNOSIS — R0602 Shortness of breath: Secondary | ICD-10-CM | POA: Diagnosis present

## 2022-12-31 LAB — BASIC METABOLIC PANEL
Anion gap: 8 (ref 5–15)
BUN: 13 mg/dL (ref 8–23)
CO2: 26 mmol/L (ref 22–32)
Calcium: 8.8 mg/dL — ABNORMAL LOW (ref 8.9–10.3)
Chloride: 107 mmol/L (ref 98–111)
Creatinine, Ser: 0.75 mg/dL (ref 0.44–1.00)
GFR, Estimated: >60 mL/min (ref >60)
Glucose, Bld: 86 mg/dL (ref 70–99)
Potassium: 4.3 mmol/L (ref 3.5–5.1)
Sodium: 141 mmol/L (ref 135–145)

## 2022-12-31 LAB — HEPARIN LEVEL (UNFRACTIONATED): Heparin Unfractionated: 0.24 [IU]/mL — ABNORMAL LOW (ref 0.30–0.70)

## 2022-12-31 LAB — CBC
HCT: 40.6 % (ref 36.0–46.0)
Hemoglobin: 12.8 g/dL (ref 12.0–15.0)
MCH: 28.9 pg (ref 26.0–34.0)
MCHC: 31.5 g/dL (ref 30.0–36.0)
MCV: 91.6 fL (ref 80.0–100.0)
Platelets: 220 10*3/uL (ref 150–400)
RBC: 4.43 MIL/uL (ref 3.87–5.11)
RDW: 13.6 % (ref 11.5–15.5)
WBC: 8 10*3/uL (ref 4.0–10.5)
nRBC: 0 % (ref 0.0–0.2)

## 2022-12-31 LAB — TROPONIN I (HIGH SENSITIVITY)
Troponin I (High Sensitivity): 4 ng/L (ref <18)
Troponin I (High Sensitivity): 5 ng/L (ref <18)
Troponin I (High Sensitivity): 5 ng/L (ref <18)

## 2022-12-31 LAB — APTT: aPTT: 28 s (ref 24–36)

## 2022-12-31 LAB — PROTIME-INR
INR: 1.1 (ref 0.8–1.2)
Prothrombin Time: 13.9 s (ref 11.4–15.2)

## 2022-12-31 MED ORDER — LABETALOL HCL 5 MG/ML IV SOLN
5.0000 mg | INTRAVENOUS | Status: DC | PRN
  Administered 2023-01-01: 5 mg via INTRAVENOUS
  Filled 2022-12-31: qty 4

## 2022-12-31 MED ORDER — HEPARIN BOLUS VIA INFUSION
1100.0000 [IU] | Freq: Once | INTRAVENOUS | Status: AC
  Administered 2022-12-31: 1100 [IU] via INTRAVENOUS
  Filled 2022-12-31: qty 1100

## 2022-12-31 MED ORDER — ONDANSETRON HCL 4 MG/2ML IJ SOLN: 4.0000 mg | Freq: Four times a day (QID) | INTRAMUSCULAR | Status: DC | PRN

## 2022-12-31 MED ORDER — HEPARIN BOLUS VIA INFUSION
4000.0000 [IU] | Freq: Once | INTRAVENOUS | Status: AC
  Administered 2022-12-31: 4000 [IU] via INTRAVENOUS
  Filled 2022-12-31: qty 4000

## 2022-12-31 MED ORDER — AMLODIPINE BESYLATE 5 MG PO TABS
5.0000 mg | ORAL_TABLET | Freq: Two times a day (BID) | ORAL | Status: DC
  Administered 2022-12-31 – 2023-01-01 (×2): 5 mg via ORAL
  Filled 2022-12-31 (×2): qty 1

## 2022-12-31 MED ORDER — ASPIRIN 81 MG PO TBEC
81.0000 mg | DELAYED_RELEASE_TABLET | Freq: Every day | ORAL | Status: DC
  Administered 2023-01-01: 81 mg via ORAL
  Filled 2022-12-31: qty 1

## 2022-12-31 MED ORDER — ASPIRIN 325 MG PO TABS
325.0000 mg | ORAL_TABLET | Freq: Once | ORAL | Status: AC
  Administered 2022-12-31: 325 mg via ORAL
  Filled 2022-12-31: qty 1

## 2022-12-31 MED ORDER — HEPARIN (PORCINE) 25000 UT/250ML-% IV SOLN
1050.0000 [IU]/h | INTRAVENOUS | Status: DC
  Administered 2022-12-31: 900 [IU]/h via INTRAVENOUS
  Administered 2023-01-01: 1050 [IU]/h via INTRAVENOUS
  Filled 2022-12-31 (×2): qty 250

## 2022-12-31 MED ORDER — BENAZEPRIL HCL 10 MG PO TABS
10.0000 mg | ORAL_TABLET | Freq: Every day | ORAL | Status: DC
  Administered 2023-01-01: 10 mg via ORAL
  Filled 2022-12-31: qty 1

## 2022-12-31 MED ORDER — ACETAMINOPHEN 325 MG PO TABS: 650.0000 mg | ORAL_TABLET | ORAL | Status: DC | PRN

## 2022-12-31 MED ORDER — ROSUVASTATIN CALCIUM 20 MG PO TABS
10.0000 mg | ORAL_TABLET | Freq: Every day | ORAL | Status: DC
  Administered 2023-01-01: 10 mg via ORAL
  Filled 2022-12-31: qty 1

## 2022-12-31 NOTE — Assessment & Plan Note (Addendum)
Patient presents with progressively worsening chest discomfort and dyspnea with exertion.  Recent positive outpatient nuke stress test. -- [facility] cardiology consulted --Treated with IV Heparin drip  --Cardiac cath done today - see report -- no interventions.  Medical mgmt recommended. --Follow-up with cardiology

## 2022-12-31 NOTE — Consult Note (Signed)
Pharmacy Consult Note - Anticoagulation  Pharmacy Consult for heparin Indication: chest pain/ACS  PATIENT MEASUREMENTS: Height: 5\' 3"  (160 cm) Weight: 89.8 kg (198 lb) IBW/kg (Calculated) : 52.4 HEPARIN DW (KG): 72.8  VITAL SIGNS: Temp: 97.9 F (36.6 C) (11/14 1147) Temp Source: Oral (11/14 1147) BP: 153/88 (11/14 1147) Pulse Rate: 80 (11/14 1147)  Recent Labs    12/31/22 1146 12/31/22 1249 12/31/22 1424  HGB 12.8  --   --   HCT 40.6  --   --   PLT 220  --   --   APTT  --  28  --   LABPROT  --  13.9  --   INR  --  1.1  --   CREATININE 0.75  --   --   TROPONINIHS 4  --  5    Estimated Creatinine Clearance: 61.7 mL/min (by C-G formula based on SCr of 0.75 mg/dL).  PAST MEDICAL HISTORY: Past Medical History:  Diagnosis Date   Arthritis    Hypertension    Plantar fasciitis     ASSESSMENT: 77 y.o. female with PMH HTN, HLD, anemia, obesity, prediabetes is presenting with worsening chest tightness and DOE. Recently seen in October by PCP regarding coronary artery calcification as seen on CT scan. Here in ED her ECG is exhibiting ST changes and her cTn is flat at 4. Patient is not on chronic anticoagulation per chart review. Pharmacy has been consulted to initiate and manage heparin intravenous infusion.  Pertinent medications: No chronic anticoagulation prior to admission per chart review  Goal(s) of therapy: Heparin level 0.3 - 0.7 units/mL Monitor platelets by anticoagulation protocol: Yes   Baseline anticoagulation labs: Recent Labs    12/31/22 1146 12/31/22 1249  APTT  --  28  INR  --  1.1  HGB 12.8  --   PLT 220  --      PLAN: anti-Xa level subtherapeutic Give 1100 units bolus x1; then increase heparin infusion rate to 1050 units/hour. Check heparin level in 8 hours after rate change, then daily once at least two levels are consecutively therapeutic. Monitor CBC daily while on heparin infusion.  Burnis Medin, PharmD, BCPS Clinical  Pharmacist 12/31/2022 3:14 PM

## 2022-12-31 NOTE — Assessment & Plan Note (Addendum)
BP mildly elevated 153/88 on admission. Resumed on home regimen  IV labetalol PRN

## 2022-12-31 NOTE — Assessment & Plan Note (Addendum)
Recent lipid panel 11/06/2022 -total cholesterol 254, LDL 166, HDL 51.5, triglycerides 184. -- Crestor

## 2022-12-31 NOTE — ED Provider Notes (Signed)
[facility] Provider Note    Event Date/Time   First MD Initiated Contact with Patient 12/31/22 1224     (approximate)   History   Chief Complaint: Chest Pain and Shortness of Breath   HPI  Sheryl Davis is a 77 y.o. female sent to the ED from her cardiology clinic due to worsening chest tightness and shortness of breath with exertion for the last several days, worsening.  Now having pain at rest.  Had an abnormal cardiac stress test on 12/17/2022, due to severity of ongoing symptoms was sent to the ED today for admission and in-hospital cardiac catheterization          Physical Exam   Triage Vital Signs: ED Triage Vitals  Encounter Vitals Group     BP 12/31/22 1147 (!) 153/88     Systolic BP Percentile --      Diastolic BP Percentile --      Pulse Rate 12/31/22 1147 80     Resp 12/31/22 1147 16     Temp 12/31/22 1147 97.9 F (36.6 C)     Temp Source 12/31/22 1147 Oral     SpO2 12/31/22 1147 95 %     Weight 12/31/22 1143 198 lb (89.8 kg)     Height 12/31/22 1143 5\' 3"  (1.6 m)     Head Circumference --      Peak Flow --      Pain Score 12/31/22 1143 8     Pain Loc --      Pain Education --      Exclude from Growth Chart --     Most recent vital signs: Vitals:   12/31/22 1147  BP: (!) 153/88  Pulse: 80  Resp: 16  Temp: 97.9 F (36.6 C)  SpO2: 95%    General: Awake, no distress.  CV:  Good peripheral perfusion.  Regular rate rhythm Resp:  Normal effort.  Clear to auscultation bilaterally Abd:  No distention.  Other:  No lower extremity edema   ED Results / Procedures / Treatments   Labs (all labs ordered are listed, but only abnormal results are displayed) Labs Reviewed  BASIC METABOLIC PANEL - Abnormal; Notable for the following components:      Result Value   Calcium 8.8 (*)    All other components within normal limits  CBC  APTT  PROTIME-INR  HEPARIN LEVEL (UNFRACTIONATED)  TROPONIN I (HIGH SENSITIVITY)      EKG Interpreted by me Normal sinus rhythm rate of 74.  Normal axis, normal intervals.  Normal QRS ST segments and T waves.   RADIOLOGY Chest x-ray interpreted by me, appears normal.  Radiology report reviewed   PROCEDURES:  .Critical Care  Performed by: Sharman Cheek, MD Authorized by: Sharman Cheek, MD   Critical care provider statement:    Critical care time (minutes):  35   Critical care time was exclusive of:  Separately billable procedures and treating other patients   Critical care was necessary to treat or prevent imminent or life-threatening deterioration of the following conditions:  Cardiac failure and circulatory failure   Critical care was time spent personally by me on the following activities:  Development of treatment plan with patient or surrogate, discussions with consultants, evaluation of patient's response to treatment, examination of patient, obtaining history from patient or surrogate, ordering and performing treatments and interventions, ordering and review of laboratory studies, ordering and review of radiographic studies, pulse oximetry, re-evaluation of patient's condition and review of old  charts   Care discussed with: admitting provider      MEDICATIONS ORDERED IN ED: Medications  heparin ADULT infusion 100 units/mL (25000 units/230mL) (has no administration in time range)  heparin bolus via infusion 4,000 Units (has no administration in time range)     IMPRESSION / MDM / ASSESSMENT AND PLAN / ED COURSE  I reviewed the triage vital signs and the nursing notes.  DDx: Non-STEMI, unstable angina, electrolyte abnormality, anemia  Patient's presentation is most consistent with acute presentation with potential threat to life or bodily function.  Patient sent to ED due to worsening exertional symptoms concerning for unstable angina.  Vital signs unremarkable, EKG and chest x-ray okay for now.  Discussed with patient's cardiology team, will  contact hospitalist for admission, start heparin.    ----------------------------------------- 1:10 PM on 12/31/2022 ----------------------------------------- Initial troponin normal.  Heparin ordered per pharmacy protocol.  Case discussed with hospitalist for further management.     FINAL CLINICAL IMPRESSION(S) / ED DIAGNOSES   Final diagnoses:  Unstable angina (HCC)     Rx / DC Orders   ED Discharge Orders     None        Note:  This document was prepared using Dragon voice recognition software and may include unintentional dictation errors.   Sharman Cheek, MD 12/31/22 1311

## 2022-12-31 NOTE — ED Triage Notes (Addendum)
Pt c/o intermittent chest pain and SOB for the last several days. Pt recently had positive cardiac stress test on 10/31 with plan for heart catheterization.

## 2022-12-31 NOTE — H&P (Signed)
History and Physical    Patient: Sheryl Davis WJX:[id number] DOB: 11-02-1945 DOA: 12/31/2022 DOS: the patient was seen and examined on 12/31/2022 PCP: Lynnea Ferrier, MD  Patient coming from: Home  Chief Complaint:  Chief Complaint  Patient presents with   Chest Pain   Shortness of Breath   HPI: Sheryl Davis is a 77 y.o. female with medical history significant of hypertension, hyperlipidemia, prediabetes, osteoarthritis who presents to the ED from [facility] cardiology clinic today for evaluation of chest pain and shortness of breath notably worse on exertion.  She was referred by PCP to cardiology and underwent nuclear stress test on 12/17/2022 that was grossly positive for reversible ischemia.  Outpatient cardiac cath was planned but still pending.  Patient presenting today with ongoing episodes of chest pain described as severe pressure that occur and worsen with any exertion.  Dyspnea with exertion has been getting progressively worse over time as well.  She denies recent illnesses or symptoms including fever/chills, sore throat, congestion, headaches, abdominal pain, nausea, vomiting, diarrhea, dysuria, unilateral weakness numbness or tingling or any other complaints.  ED course: Temp 97.9, HR 80, RR 16, BP 153/88, SpO2 95% on room air. Labs: BMP normal except calcium 8.8.  CBC normal.  Normal coags.   Normal hs-troponin at 4. EKG: Normal sinus rhythm at 74 bpm, nonspecific ST-T changes, no ST elevation. Chest x-ray: Formal read is pending.  Images personally reviewed and appears no acute findings.  Patient was started on heparin in the ED and is being admitted for observation and further evaluation and management per [facility] cardiology including cardiac cath.    Review of Systems: As mentioned in the history of present illness. All other systems reviewed and are negative.   Past Medical History:  Diagnosis Date   Arthritis    Hypertension    Plantar fasciitis    Past Surgical  History:  Procedure Laterality Date   APPENDECTOMY  2000   EYE SURGERY Bilateral 1996   cataracts   KNEE ARTHROSCOPY WITH MENISCAL REPAIR Right 06/22/2014   Procedure: KNEE ARTHROSCOPY WITH MENISCAL REPAIR;  Surgeon: Erin Sons, MD;  Location: [facility];  Service: Orthopedics;  Laterality: Right;   PLANTAR FASCIA RELEASE     Social History:  reports that she quit smoking about 24 years ago. Her smoking use included cigarettes. She started smoking about 64 years ago. She has a 20 pack-year smoking history. She has never used smokeless tobacco. She reports current alcohol use. No history on file for drug use.  No Known Allergies  Family History  Problem Relation Age of Onset   Emphysema Father    Throat cancer Father    Lung cancer Father    Breast cancer Neg Hx     Prior to Admission medications   Medication Sig Start Date End Date Taking? Authorizing Provider  amLODipine (NORVASC) 10 MG tablet Take 5 mg by mouth 2 (two) times daily. AM    [provider]  Ascorbic Acid (VITAMIN C PO) Take 1,000 mg by mouth daily.    [provider]  aspirin 81 MG tablet Take 81 mg by mouth daily.    [provider]  B Complex-C (SUPER B COMPLEX PO) Take by mouth daily.    [provider]  benazepril (LOTENSIN) 10 MG tablet Take 10 mg by mouth daily. AM    [provider]  Bioflavonoid Products (ESTER C PO) Take 1,000 mg by mouth daily.    [provider]  calcium carbonate (OS-CAL) 600 MG TABS tablet Take 600 mg by mouth daily.    [provider]  Cholecalciferol (VITAMIN D-3 PO) Take 1,000 Int'l Units by mouth daily.    [provider]  COCONUT OIL PO Take 1,000 mg by mouth daily.    [provider]  Flaxseed, Linseed, (FLAXSEED OIL PO) Take 1,000 mg by mouth daily.    [provider]  Garlic 1000 MG CAPS Take 1,000 mg by mouth daily.    [provider]  GINKGO BILOBA PO Take 120 mg by  mouth daily.    [provider]  GLUCOSAMINE CHONDROITIN COMPLX PO Take 1 capsule by mouth daily.     [provider]  Multiple Vitamins-Minerals (ONE-A-DAY 50 PLUS PO) Take by mouth daily.    [provider]  Omega-3 Fatty Acids (FISH OIL PO) Take 1,000 mg by mouth daily.    [provider]  Red Yeast Rice Extract (RED YEAST RICE PO) Take 600 mg by mouth daily.    [provider]  VITAMIN E PO Take 400 Int'l Units by mouth daily.    [provider]  Zinc 50 MG TABS Take 50 mg by mouth daily.    [provider]    Physical Exam: Vitals:   12/31/22 1143 12/31/22 1147  BP:  (!) 153/88  Pulse:  80  Resp:  16  Temp:  97.9 F (36.6 C)  TempSrc:  Oral  SpO2:  95%  Weight: 89.8 kg   Height: 5\' 3"  (1.6 m)    General exam: awake, alert, no acute distress HEENT: atraumatic, clear conjunctiva, anicteric sclera, moist mucus membranes, hearing grossly normal  Respiratory system: CTAB, no wheezes, rales or rhonchi, normal respiratory effort. Cardiovascular system: normal S1/S2,  RRR, no JVD, murmurs, rubs, gallops, no pedal edema.   Gastrointestinal system: soft, NT, ND, no HSM felt, +bowel sounds. Central nervous system: A&O x 4. no gross focal neurologic deficits, normal speech Extremities: moves all, no edema, normal tone Skin: dry, intact, normal temperature, normal color, No rashes, lesions or ulcers Psychiatry: normal mood, congruent affect, judgement and insight appear normal   Data Reviewed:  Labs and studies as reviewed above, see ED course  Assessment and Plan: * Unstable angina (HCC) Patient presents with progressively worsening chest discomfort and dyspnea with exertion.  Recent positive outpatient nuke stress test. -- [facility] cardiology consulted --Heparin drip started in the ED, continue --Anticipate cardiac cath either later today or tomorrow morning --N.p.o. except sips with meds for now --Stat EKG and repeat  troponin if chest pain --Follow-up cardiology recommendations  Hyperlipidemia Recent lipid panel 11/06/2022 -total cholesterol 254, LDL 166, HDL 51.5, triglycerides 184. -- Appears not on statin but med history still pending  Essential hypertension BP mildly elevated 153/88 on admission. Resume home regimen pending med history. As needed labetalol for now      Advance Care Planning: CODE STATUS: Full code  Consults: Case cardiology, Dr. Corky Sing  Family Communication: Daughter at bedside on admission encounter  Severity of Illness: The appropriate patient status for this patient is OBSERVATION. Observation status is judged to be reasonable and necessary in order to provide the required intensity of service to ensure the patient's safety. The patient's presenting symptoms, physical exam findings, and initial radiographic and laboratory data in the context of their medical condition is felt to place them at decreased risk for further clinical deterioration. Furthermore, it is anticipated that the patient will be medically stable for discharge from  the hospital within 2 midnights of admission.   Author: Pennie Banter, DO 12/31/2022 1:55 PM  For on call review [email or web address].

## 2022-12-31 NOTE — Consult Note (Signed)
Pharmacy Consult Note - Anticoagulation  Pharmacy Consult for heparin Indication: chest pain/ACS  PATIENT MEASUREMENTS: Height: 5\' 3"  (160 cm) Weight: 89.8 kg (198 lb) IBW/kg (Calculated) : 52.4 HEPARIN DW (KG): 72.8  VITAL SIGNS: Temp: 97.9 F (36.6 C) (11/14 1147) Temp Source: Oral (11/14 1147) BP: 153/88 (11/14 1147) Pulse Rate: 80 (11/14 1147)  Recent Labs    12/31/22 1146  HGB 12.8  HCT 40.6  PLT 220    CrCl cannot be calculated (No successful lab value found.).  PAST MEDICAL HISTORY: Past Medical History:  Diagnosis Date   Arthritis    Hypertension    Plantar fasciitis     ASSESSMENT: 77 y.o. female with PMH HTN, HLD, anemia, obesity, prediabetes is presenting with worsening chest tightness and DOE. Recently seen in October by PCP regarding coronary artery calcification as seen on CT scan. Here in ED her ECG is exhibiting ST changes and her cTn is flat at 4. Patient is not on chronic anticoagulation per chart review. Pharmacy has been consulted to initiate and manage heparin intravenous infusion.  Pertinent medications: No chronic anticoagulation prior to admission per chart review  Goal(s) of therapy: Heparin level 0.3 - 0.7 units/mL Monitor platelets by anticoagulation protocol: Yes   Baseline anticoagulation labs: Recent Labs    12/31/22 1146  HGB 12.8  PLT 220    Date Time aPTT/HL Rate/Comment   PLAN: Give 4000 units bolus x1; then start heparin infusion at 900 units/hour. Check heparin level in 8 hours, then daily once at least two levels are consecutively therapeutic. Monitor CBC daily while on heparin infusion.    Will M. Dareen Piano, PharmD Clinical Pharmacist 12/31/2022 12:40 PM

## 2022-12-31 NOTE — Consult Note (Signed)
[facility] CLINIC CARDIOLOGY CONSULT NOTE       Patient ID: Sheryl Davis MRN: [id number] DOB/AGE: 05/30/1945 77 y.o.  Admit date: 12/31/2022 Referring Physician Dr. Esaw Grandchild Primary Physician Lynnea Ferrier, MD Primary Cardiologist Clotilde Dieter, MD Reason for Consultation unstable angina  HPI: Sheryl Davis is a 77 y.o. female  with a past medical history of hypertension, hyperlipidemia who presented to the ED on 12/31/2022 for chest pain, dyspnea on exertion. Cardiology was consulted for further evaluation.   Patient presented to the ED with worsening chest pain and dyspnea on exertion. Patient was referred by her primary cardiologist (Dr. Melton Alar) after nuclear stress test (12/17/2022) revealed reversible ischemia. Patient has had persistent chest pain that is worse with exertion along with worsening DOE.  She reached out to [facility] cardiology today about her worsening symptoms and wants advised to go to the ED for further evaluation.  Work up in the ED notable for Na 141, K 4.3, Cr 0.75, Hgb 12.8. Troponin 4 > 5. CXR shows no active cardiopulmonary disease. EKG with normal sinus rhythm, rate 74 bpm, nonacute. BP 153/88, HR 80, afebrile. Patient started on heparin infusion.    At the time of my evaluation this evening, patient was sitting comfortably in ED stretcher with daughter present at bedside.  Discussed her recent worsening symptoms.  She states that she has had chest pain and shortness of breath for a few months but over the last few weeks this has gotten significantly worse.  She describes the pain as "crushing" and is usually worse with exertion.  Also reports shortness of breath with minimal exertion.  Her symptoms resolved with rest.  She denies any dizziness, syncope, palpitations.  She recently underwent outpatient stress testing which was abnormal.  Review of systems complete and found to be negative unless listed above    Past Medical History:  Diagnosis Date    Arthritis    Hypertension    Plantar fasciitis     Past Surgical History:  Procedure Laterality Date   APPENDECTOMY  2000   EYE SURGERY Bilateral 1996   cataracts   KNEE ARTHROSCOPY WITH MENISCAL REPAIR Right 06/22/2014   Procedure: KNEE ARTHROSCOPY WITH MENISCAL REPAIR;  Surgeon: Erin Sons, MD;  Location: [facility];  Service: Orthopedics;  Laterality: Right;   PLANTAR FASCIA RELEASE      (Not in a hospital admission)  Social History   Socioeconomic History   Marital status: Married    Spouse name: Not on file   Number of children: Not on file   Years of education: Not on file   Highest education level: Not on file  Occupational History   Not on file  Tobacco Use   Smoking status: Former    Current packs/day: 0.00    Average packs/day: 0.5 packs/day for 40.0 years (20.0 ttl pk-yrs)    Types: Cigarettes    Start date: 06/15/1958    Quit date: 06/15/1998    Years since quitting: 24.5   Smokeless tobacco: Never  Substance and Sexual Activity   Alcohol use: Yes    Alcohol/week: 0.0 standard drinks of alcohol    Comment: 1glass wine every few months   Drug use: Not on file   Sexual activity: Not on file  Other Topics Concern   Not on file  Social History Narrative   Not on file   Social Determinants of Health   Financial Resource Strain: Low Risk  (11/13/2022)   Received from [facility]  Overall Financial Resource Strain (CARDIA)    Difficulty of Paying Living Expenses: Not hard at all  Food Insecurity: No Food Insecurity (11/13/2022)   Received from [facility]   Hunger Vital Sign    Worried About Running Out of Food in the Last Year: Never true    Ran Out of Food in the Last Year: Never true  Transportation Needs: No Transportation Needs (11/13/2022)   Received from [facility] - Transportation    In the past 12 months, has lack of transportation kept you from medical appointments or  from getting medications?: No    Lack of Transportation (Non-Medical): No  Physical Activity: Not on file  Stress: Not on file  Social Connections: Not on file  Intimate Partner Violence: Not on file    Family History  Problem Relation Age of Onset   Emphysema Father    Throat cancer Father    Lung cancer Father    Breast cancer Neg Hx      Vitals:   12/31/22 1143 12/31/22 1147  BP:  (!) 153/88  Pulse:  80  Resp:  16  Temp:  97.9 F (36.6 C)  TempSrc:  Oral  SpO2:  95%  Weight: 89.8 kg   Height: 5\' 3"  (1.6 m)     PHYSICAL EXAM General: Well appearing elderly female, well nourished, in no acute distress. HEENT: Normocephalic and atraumatic. Neck: No JVD.   Lungs: Normal respiratory effort on room air. Clear bilaterally to auscultation. No wheezes, crackles, rhonchi.  Heart: HRRR. Normal S1 and S2 without gallops or murmurs.  Abdomen: Non-distended appearing.  Msk: Normal strength and tone for age. Extremities: Warm and well perfused. No clubbing, cyanosis. No edema.  Neuro: Alert and oriented X 3. Psych: Answers questions appropriately.   Labs: Basic Metabolic Panel: Recent Labs    12/31/22 1146  NA 141  K 4.3  CL 107  CO2 26  GLUCOSE 86  BUN 13  CREATININE 0.75  CALCIUM 8.8*   Liver Function Tests: No results for input(s): "AST", "ALT", "ALKPHOS", "BILITOT", "PROT", "ALBUMIN" in the last 72 hours. No results for input(s): "LIPASE", "AMYLASE" in the last 72 hours. CBC: Recent Labs    12/31/22 1146  WBC 8.0  HGB 12.8  HCT 40.6  MCV 91.6  PLT 220   Cardiac Enzymes: Recent Labs    12/31/22 1146 12/31/22 1424  TROPONINIHS 4 5   BNP: No results for input(s): "BNP" in the last 72 hours. D-Dimer: No results for input(s): "DDIMER" in the last 72 hours. Hemoglobin A1C: No results for input(s): "HGBA1C" in the last 72 hours. Fasting Lipid Panel: No results for input(s): "CHOL", "HDL", "LDLCALC", "TRIG", "CHOLHDL", "LDLDIRECT" in the last 72  hours. Thyroid Function Tests: No results for input(s): "TSH", "T4TOTAL", "T3FREE", "THYROIDAB" in the last 72 hours.  Invalid input(s): "FREET3" Anemia Panel: No results for input(s): "VITAMINB12", "FOLATE", "FERRITIN", "TIBC", "IRON", "RETICCTPCT" in the last 72 hours.   Radiology: DG Chest 2 View  Result Date: 12/31/2022 CLINICAL DATA:  Intermittent chest pain EXAM: CHEST - 2 VIEW COMPARISON:  None Available. FINDINGS: Normal mediastinum and cardiac silhouette. Normal pulmonary vasculature. No evidence of effusion, infiltrate, or pneumothorax. No acute bony abnormality. IMPRESSION: No acute cardiopulmonary process. Electronically Signed   By: Genevive Bi M.D.   On: 12/31/2022 15:35    ECHO pending  TELEMETRY reviewed by me 12/31/2022: Sinus rhythm rate 70s, occasional PVCs this is good  EKG reviewed by me:  NSR, rate 74 bpm (non-acute)  Stress test (12/15/2022) There is medium size, mild intensity, reversible defect noted in mid to apical anterior and apical lateral wall which just suggest ischemia in LAD territory, sum difference score 3.  No TID.  Left ventricle normal in size with LVEF of 70%.   Data reviewed by me 12/31/2022: last 24h vitals tele labs imaging I/O ED provider note, admission H&P.  Principal Problem:   Unstable angina (HCC) Active Problems:   Essential hypertension   Hyperlipidemia    ASSESSMENT AND PLAN:  Sheryl Davis is a 77 y.o. female  with a past medical history of hypertension, hyperlipidemia who presented to the ED on 12/31/2022 for chest pain, dyspnea on exertion and grossly positive stress test. Cardiology was consulted for further evaluation.   #Unstable Angina Patient presents to the ED after being referred by her primary cardiologist due to worsening chest pain and DOE. Patient's recent nuclear stress test (12/17/2022) revealed reversible ischemia in LAD territory. Patient started in heparin infusion in ED. EKG in ED showed NSR, rate 74 bpm  with no ST changes. Troponin 4 > 5.  -Continue heparin infusion. -Ordered 324 mg ASA.  -Trend Troponin. -Echo pending -Plan for [facility] tomorrow with Dr. Kirke Corin.  Procedure discussed in detail with patient.  She is amenable to proceeding.  Written consent will be obtained.  Start NPO at midnight.   #Hyperlipidemia #Hypertension Elevated BP in ED 153/88. Most recent lipid panel 10/2022 (Chol 254, TG 194, HDL 51.5, LDL 166) -Resume home amlodipine 5 mg twice daily, benazepril 10 mg daily. -Resume home rosuvastatin 10 mg daily. -Restart ASA 81 mg tomorrow -Continue to monitor BP.   This patient's plan of care was discussed and created with Dr. Corky Sing and he is in agreement.  Signed: Gale Journey, PA-C  12/31/2022, 4:39 PM [facility] Cardiology

## 2023-01-01 ENCOUNTER — Other Ambulatory Visit: Payer: Self-pay

## 2023-01-01 ENCOUNTER — Encounter: Payer: Self-pay | Admitting: Internal Medicine

## 2023-01-01 ENCOUNTER — Encounter
Admission: EM | Disposition: A | Discharge status: Home / Self Care | Payer: Self-pay | Source: Home / Self Care | Attending: Emergency Medicine

## 2023-01-01 ENCOUNTER — Observation Stay
Admit: 2023-01-01 | Discharge: 2023-01-01 | Disposition: A | Discharge status: Home / Self Care | Payer: Medicare Other | Attending: Student | Admitting: Student

## 2023-01-01 DIAGNOSIS — I25110 Atherosclerotic heart disease of native coronary artery with unstable angina pectoris: Secondary | ICD-10-CM

## 2023-01-01 DIAGNOSIS — I200 Unstable angina: Secondary | ICD-10-CM | POA: Diagnosis not present

## 2023-01-01 HISTORY — PX: LEFT HEART CATH AND CORONARY ANGIOGRAPHY: CATH118249

## 2023-01-01 LAB — ECHOCARDIOGRAM COMPLETE
AR max vel: 2.38 cm2
AV Area VTI: 2.73 cm2
AV Area mean vel: 2.35 cm2
AV Mean grad: 4 mm[Hg]
AV Peak grad: 9.7 mm[Hg]
Ao pk vel: 1.56 m/s
Area-P 1/2: 2.83 cm2
Calc EF: 63.4 %
Height: 63 in
MV VTI: 2.53 cm2
S' Lateral: 3 cm
Single Plane A2C EF: 68.4 %
Single Plane A4C EF: 59.3 %
Weight: 3168 [oz_av]

## 2023-01-01 LAB — BASIC METABOLIC PANEL
Anion gap: 10 (ref 5–15)
BUN: 12 mg/dL (ref 8–23)
CO2: 25 mmol/L (ref 22–32)
Calcium: 8.9 mg/dL (ref 8.9–10.3)
Chloride: 108 mmol/L (ref 98–111)
Creatinine, Ser: 0.56 mg/dL (ref 0.44–1.00)
GFR, Estimated: >60 mL/min (ref >60)
Glucose, Bld: 94 mg/dL (ref 70–99)
Potassium: 3.7 mmol/L (ref 3.5–5.1)
Sodium: 143 mmol/L (ref 135–145)

## 2023-01-01 LAB — CBC
HCT: 38 % (ref 36.0–46.0)
Hemoglobin: 12.4 g/dL (ref 12.0–15.0)
MCH: 28.9 pg (ref 26.0–34.0)
MCHC: 32.6 g/dL (ref 30.0–36.0)
MCV: 88.6 fL (ref 80.0–100.0)
Platelets: 186 10*3/uL (ref 150–400)
RBC: 4.29 MIL/uL (ref 3.87–5.11)
RDW: 13.7 % (ref 11.5–15.5)
WBC: 6.8 10*3/uL (ref 4.0–10.5)
nRBC: 0 % (ref 0.0–0.2)

## 2023-01-01 LAB — HEPARIN LEVEL (UNFRACTIONATED): Heparin Unfractionated: 0.66 [IU]/mL (ref 0.30–0.70)

## 2023-01-01 SURGERY — LEFT HEART CATH AND CORONARY ANGIOGRAPHY
Anesthesia: Moderate Sedation

## 2023-01-01 MED ORDER — SODIUM CHLORIDE 0.9% FLUSH: 3.0000 mL | INTRAVENOUS | Status: DC | PRN

## 2023-01-01 MED ORDER — SODIUM CHLORIDE 0.9 % IV SOLN: INTRAVENOUS | Status: DC

## 2023-01-01 MED ORDER — SODIUM CHLORIDE 0.9 % IV SOLN: 250.0000 mL | INTRAVENOUS | Status: DC | PRN

## 2023-01-01 MED ORDER — VERAPAMIL HCL 2.5 MG/ML IV SOLN
INTRAVENOUS | Status: AC
  Filled 2023-01-01: qty 2

## 2023-01-01 MED ORDER — SODIUM CHLORIDE 0.9% FLUSH: 3.0000 mL | Freq: Two times a day (BID) | INTRAVENOUS | Status: DC

## 2023-01-01 MED ORDER — HEPARIN SODIUM (PORCINE) 1000 UNIT/ML IJ SOLN
INTRAMUSCULAR | Status: DC | PRN
  Administered 2023-01-01: 4500 [IU] via INTRAVENOUS

## 2023-01-01 MED ORDER — SODIUM CHLORIDE 0.9 % WEIGHT BASED INFUSION
3.0000 mL/kg/h | INTRAVENOUS | Status: DC
Start: 2023-01-01 — End: 2023-01-01
  Administered 2023-01-01: 3 mL/kg/h via INTRAVENOUS

## 2023-01-01 MED ORDER — HEPARIN (PORCINE) IN NACL 1000-0.9 UT/500ML-% IV SOLN
INTRAVENOUS | Status: AC
  Filled 2023-01-01: qty 1000

## 2023-01-01 MED ORDER — SODIUM CHLORIDE 0.9 % IV SOLN
250.0000 mL | INTRAVENOUS | Status: DC | PRN
Start: 2023-01-01 — End: 2023-01-01

## 2023-01-01 MED ORDER — SODIUM CHLORIDE 0.9 % WEIGHT BASED INFUSION
1.0000 mL/kg/h | INTRAVENOUS | Status: DC
Start: 2023-01-01 — End: 2023-01-01

## 2023-01-01 MED ORDER — FENTANYL CITRATE (PF) 100 MCG/2ML IJ SOLN
INTRAMUSCULAR | Status: DC | PRN
  Administered 2023-01-01 (×2): 25 ug via INTRAVENOUS

## 2023-01-01 MED ORDER — IOHEXOL 300 MG/ML  SOLN
INTRAMUSCULAR | Status: DC | PRN
  Administered 2023-01-01: 38 mL

## 2023-01-01 MED ORDER — HEPARIN (PORCINE) IN NACL 2000-0.9 UNIT/L-% IV SOLN
INTRAVENOUS | Status: DC | PRN
  Administered 2023-01-01: 1000 mL

## 2023-01-01 MED ORDER — ASPIRIN 81 MG PO CHEW: 81.0000 mg | CHEWABLE_TABLET | ORAL | Status: DC

## 2023-01-01 MED ORDER — VERAPAMIL HCL 2.5 MG/ML IV SOLN
INTRAVENOUS | Status: DC | PRN
  Administered 2023-01-01: 2.5 mg via INTRAVENOUS

## 2023-01-01 MED ORDER — MIDAZOLAM HCL 2 MG/2ML IJ SOLN
INTRAMUSCULAR | Status: DC | PRN
  Administered 2023-01-01: 1 mg via INTRAVENOUS

## 2023-01-01 MED ORDER — FENTANYL CITRATE (PF) 100 MCG/2ML IJ SOLN
INTRAMUSCULAR | Status: AC
  Filled 2023-01-01: qty 2

## 2023-01-01 MED ORDER — HEPARIN SODIUM (PORCINE) 1000 UNIT/ML IJ SOLN
INTRAMUSCULAR | Status: AC
  Filled 2023-01-01: qty 10

## 2023-01-01 MED ORDER — SODIUM CHLORIDE 0.9% FLUSH
3.0000 mL | Freq: Two times a day (BID) | INTRAVENOUS | Status: DC
Start: 2023-01-01 — End: 2023-01-01

## 2023-01-01 MED ORDER — MIDAZOLAM HCL 2 MG/2ML IJ SOLN
INTRAMUSCULAR | Status: AC
  Filled 2023-01-01: qty 2

## 2023-01-01 SURGICAL SUPPLY — 10 items
CATH INFINITI AMBI 5FR JK (CATHETERS) IMPLANT
DEVICE RAD TR BAND REGULAR (VASCULAR PRODUCTS) IMPLANT
DRAPE BRACHIAL (DRAPES) IMPLANT
GLIDESHEATH SLEND SS 6F .021 (SHEATH) IMPLANT
GUIDEWIRE INQWIRE 1.5J.035X260 (WIRE) IMPLANT
INQWIRE 1.5J .035X260CM (WIRE) ×1
PACK CARDIAC CATH (CUSTOM PROCEDURE TRAY) ×1 IMPLANT
PROTECTION STATION PRESSURIZED (MISCELLANEOUS) ×1
SET ATX-X65L (MISCELLANEOUS) IMPLANT
STATION PROTECTION PRESSURIZED (MISCELLANEOUS) IMPLANT

## 2023-01-01 NOTE — H&P (View-Only) (Signed)
[facility] Cardiology  CARDIOLOGY PROGRESS NOTE  Patient ID: Sheryl Davis MRN: [id number] DOB/AGE: [birth year] 79 y.o.  Admit date: 12/31/2022 Referring Physician Dr. Denton Lank Primary Physician Dr Graciela Husbands Primary Cardiologist Dr. Melton Alar Reason for Consultation unstable angina  HPI: 77 year old female who is having worsening symptoms of exertional shortness of breath and chest tightness with recent abnormal stress test.  Here with unstable angina.  Resting comfortably this morning with no complaint.  No chest pain overnight.  Review of systems complete and found to be negative unless listed above     Past Medical History:  Diagnosis Date   Arthritis    Hypertension    Plantar fasciitis     Past Surgical History:  Procedure Laterality Date   APPENDECTOMY  2000   EYE SURGERY Bilateral 1996   cataracts   KNEE ARTHROSCOPY WITH MENISCAL REPAIR Right 06/22/2014   Procedure: KNEE ARTHROSCOPY WITH MENISCAL REPAIR;  Surgeon: Erin Sons, MD;  Location: [facility];  Service: Orthopedics;  Laterality: Right;   PLANTAR FASCIA RELEASE      (Not in a hospital admission)  Social History   Socioeconomic History   Marital status: Married    Spouse name: Not on file   Number of children: Not on file   Years of education: Not on file   Highest education level: Not on file  Occupational History   Not on file  Tobacco Use   Smoking status: Former    Current packs/day: 0.00    Average packs/day: 0.5 packs/day for 40.0 years (20.0 ttl pk-yrs)    Types: Cigarettes    Start date: 06/15/1958    Quit date: 06/15/1998    Years since quitting: 24.5   Smokeless tobacco: Never  Substance and Sexual Activity   Alcohol use: Yes    Alcohol/week: 0.0 standard drinks of alcohol    Comment: 1glass wine every few months   Drug use: Not on file   Sexual activity: Not on file  Other Topics Concern   Not on file  Social History Narrative   Not on file   Social Determinants of Health    Financial Resource Strain: Low Risk  (11/13/2022)   Received from [facility]   Overall Financial Resource Strain (CARDIA)    Difficulty of Paying Living Expenses: Not hard at all  Food Insecurity: No Food Insecurity (01/01/2023)   Hunger Vital Sign    Worried About Running Out of Food in the Last Year: Never true    Ran Out of Food in the Last Year: Never true  Transportation Needs: No Transportation Needs (01/01/2023)   PRAPARE - Administrator, Civil Service (Medical): No    Lack of Transportation (Non-Medical): No  Physical Activity: Not on file  Stress: Not on file  Social Connections: Not on file  Intimate Partner Violence: Not At Risk (01/01/2023)   Humiliation, Afraid, Rape, and Kick questionnaire    Fear of Current or Ex-Partner: No    Emotionally Abused: No    Physically Abused: No    Sexually Abused: No    Family History  Problem Relation Age of Onset   Emphysema Father    Throat cancer Father    Lung cancer Father    Breast cancer Neg Hx       Review of systems complete and found to be negative unless listed above      PHYSICAL EXAM  General: Well developed, well nourished, in no acute distress HEENT:  Normocephalic and atramatic Neck:  No JVD.  Lungs: Clear bilaterally to auscultation and percussion. Heart: HRRR . Normal S1 and S2 without gallops or murmurs.    Labs:   Lab Results  Component Value Date   WBC 6.8 01/01/2023   HGB 12.4 01/01/2023   HCT 38.0 01/01/2023   MCV 88.6 01/01/2023   PLT 186 01/01/2023    Recent Labs  Lab 01/01/23 0504  NA 143  K 3.7  CL 108  CO2 25  BUN 12  CREATININE 0.56  CALCIUM 8.9  GLUCOSE 94   No results found for: "CKTOTAL", "CKMB", "CKMBINDEX", "TROPONINI" No results found for: "CHOL" No results found for: "HDL" No results found for: "LDLCALC" No results found for: "TRIG" No results found for: "CHOLHDL" No results found for: "LDLDIRECT"    Radiology: DG Chest 2  View  Result Date: 12/31/2022 CLINICAL DATA:  Intermittent chest pain EXAM: CHEST - 2 VIEW COMPARISON:  None Available. FINDINGS: Normal mediastinum and cardiac silhouette. Normal pulmonary vasculature. No evidence of effusion, infiltrate, or pneumothorax. No acute bony abnormality. IMPRESSION: No acute cardiopulmonary process. Electronically Signed   By: Genevive Bi M.D.   On: 12/31/2022 15:35    ASSESSMENT AND PLAN:  Unstable angina Abnormal stress test Hypertension, hyperlipidemia  Plan for left heart catheterization today. Continue aspirin, heparin drip, statin, antihypertensives Echo pending  Signed: Kathryne Gin MD,PhD, [facility] 01/01/2023, 12:51 PM

## 2023-01-01 NOTE — Progress Notes (Signed)
*  PRELIMINARY RESULTS* Echocardiogram 2D Echocardiogram has been performed.  IRAIMA NEIBAUER 01/01/2023, 11:08 AM

## 2023-01-01 NOTE — Discharge Summary (Signed)
Physician Discharge Summary   Patient: Sheryl Davis MRN: [id number] DOB: [birth year]  Admit date:     12/31/2022  Discharge date: 01/01/2023  Discharge Physician: Pennie Banter   PCP: Lynnea Ferrier, MD   Recommendations at discharge:  {Tip this will not be part of the note when signed- Example include specific recommendations for outpatient follow-up, pending tests to follow-up on. (Optional):26781}  Follow up with Cardiology Follow up with Primary Care Repeat labs at follow up  Discharge Diagnoses: Principal Problem:   Unstable angina [facility]) Active Problems:   Essential hypertension   Hyperlipidemia  Resolved Problems:   * No resolved hospital problems. [facility] Course: No notes on file  Assessment and Plan: * Unstable angina [facility]) Patient presents with progressively worsening chest discomfort and dyspnea with exertion.  Recent positive outpatient nuke stress test. -- [facility] cardiology consulted --Heparin drip started in the ED, continue --Anticipate cardiac cath either later today or tomorrow morning --N.p.o. except sips with meds for now --Stat EKG and repeat troponin if chest pain --Follow-up cardiology recommendations  Hyperlipidemia Recent lipid panel 11/06/2022 -total cholesterol 254, LDL 166, HDL 51.5, triglycerides 184. -- Appears not on statin but med history still pending  Essential hypertension BP mildly elevated 153/88 on admission. Resume home regimen pending med history. As needed labetalol for now      {Tip this will not be part of the note when signed Body mass index is 35.07 kg/m. , ,  (Optional):26781}  {(NOTE) Pain control PDMP Statment (Optional):26782} Consultants: *** Procedures performed: ***  Disposition: {Plan; Disposition:26390} Diet recommendation:  Discharge Diet Orders (From admission, onward)     Start     Ordered   01/01/23 0000  Diet - low sodium heart healthy        01/01/23 1646            {Diet_Plan:26776} DISCHARGE MEDICATION: Allergies as of 01/01/2023   No Known Allergies      Medication List     TAKE these medications    amLODipine 5 MG tablet Commonly known as: NORVASC Take 5 mg by mouth daily. What changed: Another medication with the same name was removed. Continue taking this medication, and follow the directions you see here.   aspirin 81 MG tablet Take 81 mg by mouth daily.   benazepril 10 MG tablet Commonly known as: LOTENSIN Take 10 mg by mouth daily. AM   calcium carbonate 600 MG Tabs tablet Commonly known as: OS-CAL Take 600 mg by mouth daily.   COCONUT OIL PO Take 1,000 mg by mouth daily.   ESTER C PO Take 1,000 mg by mouth daily.   FISH OIL PO Take 1,000 mg by mouth daily.   FLAXSEED OIL PO Take 1,000 mg by mouth daily.   Garlic 1000 MG Caps Take 1,000 mg by mouth daily.   GINKGO BILOBA PO Take 120 mg by mouth daily.   GLUCOSAMINE CHONDROITIN COMPLX PO Take 1 capsule by mouth daily.   ONE-A-DAY 50 PLUS PO Take by mouth daily.   RED YEAST RICE PO Take 600 mg by mouth daily.   rOPINIRole 0.25 MG tablet Commonly known as: REQUIP Take 0.25 mg by mouth at bedtime.   rosuvastatin 10 MG tablet Commonly known as: CRESTOR Take 1 tablet by mouth daily.   SUPER B COMPLEX PO Take by mouth daily.   VITAMIN C PO Take 1,000 mg by mouth daily.   VITAMIN D-3 PO Take 1,000 Int'l Units by mouth daily.  VITAMIN E PO Take 400 Int'l Units by mouth daily.   Zinc 50 MG Tabs Take 50 mg by mouth daily.        Follow-up Information     Custovic, Rozell Searing, DO. Go in 1 week(s).   Specialty: Cardiology Why: Office already closed. Patient will call Monday. Contact information: [address] [place] [address] [phone]                Discharge Exam: Ceasar Mons Weights   12/31/22 1143 01/01/23 1309  Weight: 89.8 kg 89.8 kg   ***  Condition at discharge: {DC Condition:26389}  The results of  significant diagnostics from this hospitalization (including imaging, microbiology, ancillary and laboratory) are listed below for reference.   Imaging Studies: ECHOCARDIOGRAM COMPLETE  Result Date: 01/01/2023    ECHOCARDIOGRAM REPORT   Patient Name:   Sheryl Davis Date of Exam: 01/01/2023 Medical Rec #:  [id number]      Height:       63.0 in Accession #:    [id number]     Weight:       198.0 lb Date of Birth:  09-29-1945      BSA:          1.925 m Patient Age:    76 years       BP:           154/85 mmHg Patient Gender: F              HR:           69 bpm. Exam Location:  [facility] Procedure: 2D Echo, Cardiac Doppler, Color Doppler and Strain Analysis Indications:     Chest Pain  History:         Patient has no prior history of Echocardiogram examinations.                  Angina, Signs/Symptoms:Chest Pain and Dyspnea; Risk                  Factors:Hypertension and Dyslipidemia.  Sonographer:     Mikki Harbor Referring Phys:  [id number] CARALYN HUDSON Diagnosing Phys: Windell Norfolk  Sonographer Comments: Suboptimal subcostal window. Image acquisition challenging due to respiratory motion. Global longitudinal strain was attempted. IMPRESSIONS  1. Left ventricular ejection fraction, by estimation, is 55 to 60%. The left ventricle has normal function. The left ventricle has no regional wall motion abnormalities. Left ventricular diastolic parameters were normal.  2. Right ventricular systolic function is normal. The right ventricular size is normal. There is normal pulmonary artery systolic pressure.  3. The mitral valve is normal in structure. Trivial mitral valve regurgitation.  4. The aortic valve is tricuspid. Aortic valve regurgitation is not visualized.  5. The inferior vena cava is normal in size with greater than 50% respiratory variability, suggesting right atrial pressure of 3 mmHg. FINDINGS  Left Ventricle: Left ventricular ejection fraction, by estimation, is 55 to 60%. The left ventricle has normal  function. The left ventricle has no regional wall motion abnormalities. The left ventricular internal cavity size was normal in size. There is  no left ventricular hypertrophy. Left ventricular diastolic parameters were normal. Right Ventricle: The right ventricular size is normal. No increase in right ventricular wall thickness. Right ventricular systolic function is normal. There is normal pulmonary artery systolic pressure. The tricuspid regurgitant velocity is 2.79 m/s, and  with an assumed right atrial pressure of 3 mmHg, the estimated right ventricular systolic pressure is 34.1 mmHg. Left Atrium: Left  atrial size was normal in size. Right Atrium: Right atrial size was normal in size. Pericardium: There is no evidence of pericardial effusion. Mitral Valve: The mitral valve is normal in structure. Trivial mitral valve regurgitation. MV peak gradient, 4.7 mmHg. The mean mitral valve gradient is 2.0 mmHg. Tricuspid Valve: The tricuspid valve is normal in structure. Tricuspid valve regurgitation is mild. Aortic Valve: The aortic valve is tricuspid. Aortic valve regurgitation is not visualized. Aortic valve mean gradient measures 4.0 mmHg. Aortic valve peak gradient measures 9.7 mmHg. Aortic valve area, by VTI measures 2.73 cm. Pulmonic Valve: The pulmonic valve was not well visualized. Pulmonic valve regurgitation is trivial. Aorta: The aortic root is normal in size and structure. Venous: The inferior vena cava is normal in size with greater than 50% respiratory variability, suggesting right atrial pressure of 3 mmHg. IAS/Shunts: No atrial level shunt detected by color flow Doppler.  LEFT VENTRICLE PLAX 2D LVIDd:         4.40 cm     Diastology LVIDs:         3.00 cm     LV e' medial:    9.03 cm/s LV PW:         1.10 cm     LV E/e' medial:  9.1 LV IVS:        1.00 cm     LV e' lateral:   9.25 cm/s LVOT diam:     1.90 cm     LV E/e' lateral: 8.8 LV SV:         87 LV SV Index:   45 LVOT Area:     2.84 cm  LV Volumes  (MOD) LV vol d, MOD A2C: 43.3 ml LV vol d, MOD A4C: 33.4 ml LV vol s, MOD A2C: 13.7 ml LV vol s, MOD A4C: 13.6 ml LV SV MOD A2C:     29.6 ml LV SV MOD A4C:     33.4 ml LV SV MOD BP:      24.3 ml RIGHT VENTRICLE RV Basal diam:  3.40 cm RV Mid diam:    3.10 cm RV S prime:     15.10 cm/s TAPSE (M-mode): 2.2 cm LEFT ATRIUM           Index        RIGHT ATRIUM           Index LA diam:      3.20 cm 1.66 cm/m   RA Area:     12.90 cm LA Vol (A2C): 34.2 ml 17.76 ml/m  RA Volume:   24.50 ml  12.72 ml/m LA Vol (A4C): 44.0 ml 22.85 ml/m  AORTIC VALVE                    PULMONIC VALVE AV Area (Vmax):    2.38 cm     PV Vmax:       0.93 m/s AV Area (Vmean):   2.35 cm     PV Peak grad:  3.4 mmHg AV Area (VTI):     2.73 cm AV Vmax:           156.00 cm/s AV Vmean:          90.400 cm/s AV VTI:            0.320 m AV Peak Grad:      9.7 mmHg AV Mean Grad:      4.0 mmHg LVOT Vmax:         131.00 cm/s LVOT  Vmean:        75.000 cm/s LVOT VTI:          0.308 m LVOT/AV VTI ratio: 0.96  AORTA Ao Root diam: 3.20 cm MITRAL VALVE               TRICUSPID VALVE MV Area (PHT): 2.83 cm    TR Peak grad:   31.1 mmHg MV Area VTI:   2.53 cm    TR Vmax:        279.00 cm/s MV Peak grad:  4.7 mmHg MV Mean grad:  2.0 mmHg    SHUNTS MV Vmax:       1.08 m/s    Systemic VTI:  0.31 m MV Vmean:      67.1 cm/s   Systemic Diam: 1.90 cm MV Decel Time: 268 msec MV E velocity: 81.80 cm/s MV A velocity: 92.50 cm/s MV E/A ratio:  0.88 Windell Norfolk Electronically signed by Windell Norfolk Signature Date/Time: 01/01/2023/4:18:25 PM    Final    CARDIAC CATHETERIZATION  Result Date: 01/01/2023   Lat 1st Diag lesion is 30% stenosed. 1.  Mild nonobstructive coronary artery disease. 2.  Left ventricular angiography was not performed.  EF was normal by echo.  Normal left ventricular end-diastolic pressure. Recommendations: No culprit is identified for the patient's chest pain.  Recommend medical therapy for nonobstructive coronary artery disease. Suspect false  positive nuclear stress test.   DG Chest 2 View  Result Date: 12/31/2022 CLINICAL DATA:  Intermittent chest pain EXAM: CHEST - 2 VIEW COMPARISON:  None Available. FINDINGS: Normal mediastinum and cardiac silhouette. Normal pulmonary vasculature. No evidence of effusion, infiltrate, or pneumothorax. No acute bony abnormality. IMPRESSION: No acute cardiopulmonary process. Electronically Signed   By: Genevive Bi M.D.   On: 12/31/2022 15:35    Microbiology: No results found for this or any previous visit.  Labs: CBC: Recent Labs  Lab 12/31/22 1146 01/01/23 0504  WBC 8.0 6.8  HGB 12.8 12.4  HCT 40.6 38.0  MCV 91.6 88.6  PLT 220 186   Basic Metabolic Panel: Recent Labs  Lab 12/31/22 1146 01/01/23 0504  NA 141 143  K 4.3 3.7  CL 107 108  CO2 26 25  GLUCOSE 86 94  BUN 13 12  CREATININE 0.75 0.56  CALCIUM 8.8* 8.9   Liver Function Tests: No results for input(s): "AST", "ALT", "ALKPHOS", "BILITOT", "PROT", "ALBUMIN" in the last 168 hours. CBG: No results for input(s): "GLUCAP" in the last 168 hours.  Discharge time spent: {LESS THAN/GREATER AOZH:08657} 30 minutes.  Signed: Pennie Banter, DO [facility] 01/01/2023

## 2023-01-01 NOTE — Progress Notes (Signed)
[facility] Cardiology  CARDIOLOGY PROGRESS NOTE  Patient ID: Sheryl Davis MRN: [id number] DOB/AGE: [birth year] 79 y.o.  Admit date: 12/31/2022 Referring Physician Dr. Denton Lank Primary Physician Dr Graciela Husbands Primary Cardiologist Dr. Melton Alar Reason for Consultation unstable angina  HPI: 77 year old female who is having worsening symptoms of exertional shortness of breath and chest tightness with recent abnormal stress test.  Here with unstable angina.  Resting comfortably this morning with no complaint.  No chest pain overnight.  Review of systems complete and found to be negative unless listed above     Past Medical History:  Diagnosis Date   Arthritis    Hypertension    Plantar fasciitis     Past Surgical History:  Procedure Laterality Date   APPENDECTOMY  2000   EYE SURGERY Bilateral 1996   cataracts   KNEE ARTHROSCOPY WITH MENISCAL REPAIR Right 06/22/2014   Procedure: KNEE ARTHROSCOPY WITH MENISCAL REPAIR;  Surgeon: Erin Sons, MD;  Location: [facility];  Service: Orthopedics;  Laterality: Right;   PLANTAR FASCIA RELEASE      (Not in a hospital admission)  Social History   Socioeconomic History   Marital status: Married    Spouse name: Not on file   Number of children: Not on file   Years of education: Not on file   Highest education level: Not on file  Occupational History   Not on file  Tobacco Use   Smoking status: Former    Current packs/day: 0.00    Average packs/day: 0.5 packs/day for 40.0 years (20.0 ttl pk-yrs)    Types: Cigarettes    Start date: 06/15/1958    Quit date: 06/15/1998    Years since quitting: 24.5   Smokeless tobacco: Never  Substance and Sexual Activity   Alcohol use: Yes    Alcohol/week: 0.0 standard drinks of alcohol    Comment: 1glass wine every few months   Drug use: Not on file   Sexual activity: Not on file  Other Topics Concern   Not on file  Social History Narrative   Not on file   Social Determinants of Health    Financial Resource Strain: Low Risk  (11/13/2022)   Received from [facility]   Overall Financial Resource Strain (CARDIA)    Difficulty of Paying Living Expenses: Not hard at all  Food Insecurity: No Food Insecurity (01/01/2023)   Hunger Vital Sign    Worried About Running Out of Food in the Last Year: Never true    Ran Out of Food in the Last Year: Never true  Transportation Needs: No Transportation Needs (01/01/2023)   PRAPARE - Administrator, Civil Service (Medical): No    Lack of Transportation (Non-Medical): No  Physical Activity: Not on file  Stress: Not on file  Social Connections: Not on file  Intimate Partner Violence: Not At Risk (01/01/2023)   Humiliation, Afraid, Rape, and Kick questionnaire    Fear of Current or Ex-Partner: No    Emotionally Abused: No    Physically Abused: No    Sexually Abused: No    Family History  Problem Relation Age of Onset   Emphysema Father    Throat cancer Father    Lung cancer Father    Breast cancer Neg Hx       Review of systems complete and found to be negative unless listed above      PHYSICAL EXAM  General: Well developed, well nourished, in no acute distress HEENT:  Normocephalic and atramatic Neck:  No JVD.  Lungs: Clear bilaterally to auscultation and percussion. Heart: HRRR . Normal S1 and S2 without gallops or murmurs.    Labs:   Lab Results  Component Value Date   WBC 6.8 01/01/2023   HGB 12.4 01/01/2023   HCT 38.0 01/01/2023   MCV 88.6 01/01/2023   PLT 186 01/01/2023    Recent Labs  Lab 01/01/23 0504  NA 143  K 3.7  CL 108  CO2 25  BUN 12  CREATININE 0.56  CALCIUM 8.9  GLUCOSE 94   No results found for: "CKTOTAL", "CKMB", "CKMBINDEX", "TROPONINI" No results found for: "CHOL" No results found for: "HDL" No results found for: "LDLCALC" No results found for: "TRIG" No results found for: "CHOLHDL" No results found for: "LDLDIRECT"    Radiology: DG Chest 2  View  Result Date: 12/31/2022 CLINICAL DATA:  Intermittent chest pain EXAM: CHEST - 2 VIEW COMPARISON:  None Available. FINDINGS: Normal mediastinum and cardiac silhouette. Normal pulmonary vasculature. No evidence of effusion, infiltrate, or pneumothorax. No acute bony abnormality. IMPRESSION: No acute cardiopulmonary process. Electronically Signed   By: Genevive Bi M.D.   On: 12/31/2022 15:35    ASSESSMENT AND PLAN:  Unstable angina Abnormal stress test Hypertension, hyperlipidemia  Plan for left heart catheterization today. Continue aspirin, heparin drip, statin, antihypertensives Echo pending  Signed: Kathryne Gin MD,PhD, [facility] 01/01/2023, 12:51 PM

## 2023-01-01 NOTE — Interval H&P Note (Signed)
History and Physical Interval Note:  01/01/2023 2:31 PM  Sheryl Davis  has presented today for surgery, with the diagnosis of unstable angina.  The various methods of treatment have been discussed with the patient and family. After consideration of risks, benefits and other options for treatment, the patient has consented to  Procedure(s): LEFT HEART CATH AND CORONARY ANGIOGRAPHY (N/A) as a surgical intervention.  The patient's history has been reviewed, patient examined, no change in status, stable for surgery.  I have reviewed the patient's chart and labs.  Questions were answered to the patient's satisfaction.     Lorine Bears

## 2023-01-01 NOTE — Consult Note (Signed)
Pharmacy Consult Note - Anticoagulation  Pharmacy Consult for heparin Indication: chest pain/ACS  PATIENT MEASUREMENTS: Height: 5\' 3"  (160 cm) Weight: 89.8 kg (198 lb) IBW/kg (Calculated) : 52.4 HEPARIN DW (KG): 72.8  VITAL SIGNS: Temp: 97.7 F (36.5 C) (11/15 0807) Temp Source: Oral (11/15 0807) BP: 154/85 (11/15 0807) Pulse Rate: 71 (11/15 0807)  Recent Labs    12/31/22 1249 12/31/22 1424 12/31/22 1845 12/31/22 2142 01/01/23 0504 01/01/23 0815  HGB  --   --   --   --  12.4  --   HCT  --   --   --   --  38.0  --   PLT  --   --   --   --  186  --   APTT 28  --   --   --   --   --   LABPROT 13.9  --   --   --   --   --   INR 1.1  --   --   --   --   --   HEPARINUNFRC  --   --   --    < >  --  0.66  CREATININE  --   --   --   --  0.56  --   TROPONINIHS  --    < > 5  --   --   --    < > = values in this interval not displayed.    Estimated Creatinine Clearance: 61.7 mL/min (by C-G formula based on SCr of 0.56 mg/dL).  PAST MEDICAL HISTORY: Past Medical History:  Diagnosis Date   Arthritis    Hypertension    Plantar fasciitis     ASSESSMENT: 77 y.o. female with PMH HTN, HLD, anemia, obesity, prediabetes is presenting with worsening chest tightness and DOE. Recently seen in October by PCP regarding coronary artery calcification as seen on CT scan. Here in ED her ECG is exhibiting ST changes and her cTn is flat at 4. Patient is not on chronic anticoagulation per chart review. Pharmacy has been consulted to initiate and manage heparin intravenous infusion.  Pertinent medications: No chronic anticoagulation prior to admission per chart review  Goal(s) of therapy: Heparin level 0.3 - 0.7 units/mL Monitor platelets by anticoagulation protocol: Yes   Baseline anticoagulation labs: Recent Labs    12/31/22 1146 12/31/22 1249 01/01/23 0504  APTT  --  28  --   INR  --  1.1  --   HGB 12.8  --  12.4  PLT 220  --  186     PLAN:  11/15@0815 : HL 0.66, therapeutic x  1 Continue heparin infusion rate at 1050 units/hour. Check confirmatory heparin level in 8 hours, then daily once at least two levels are consecutively therapeutic. Monitor CBC daily while on heparin infusion.  Bettey Costa, PharmD Clinical Pharmacist 01/01/2023 8:42 AM

## 2023-01-04 ENCOUNTER — Encounter: Payer: Self-pay | Admitting: Cardiovascular Disease

## 2023-10-28 ENCOUNTER — Other Ambulatory Visit: Payer: Self-pay | Admitting: Internal Medicine

## 2023-10-28 DIAGNOSIS — Z1231 Encounter for screening mammogram for malignant neoplasm of breast: Secondary | ICD-10-CM

## 2023-11-29 ENCOUNTER — Ambulatory Visit
Admission: RE | Admit: 2023-11-29 | Discharge: 2023-11-29 | Disposition: A | Discharge status: Home / Self Care | Source: Ambulatory Visit | Attending: Internal Medicine | Admitting: Internal Medicine

## 2023-11-29 DIAGNOSIS — Z1231 Encounter for screening mammogram for malignant neoplasm of breast: Secondary | ICD-10-CM | POA: Diagnosis present

## 2023-12-01 ENCOUNTER — Other Ambulatory Visit: Payer: Self-pay | Admitting: Internal Medicine

## 2023-12-01 DIAGNOSIS — R928 Other abnormal and inconclusive findings on diagnostic imaging of breast: Secondary | ICD-10-CM

## 2023-12-03 ENCOUNTER — Ambulatory Visit
Admission: RE | Admit: 2023-12-03 | Discharge: 2023-12-03 | Disposition: A | Discharge status: Home / Self Care | Source: Ambulatory Visit | Attending: Internal Medicine | Admitting: Internal Medicine

## 2023-12-03 DIAGNOSIS — R928 Other abnormal and inconclusive findings on diagnostic imaging of breast: Secondary | ICD-10-CM
